# Patient Record
Sex: Female | Born: 1985 | Race: White | Hispanic: No | Smoking: Never smoker
Health system: Southern US, Community
[De-identification: ages and names within clinical notes are randomized; demographics above are authoritative.]

## PROBLEM LIST (undated history)

## (undated) DIAGNOSIS — E71311 Medium chain acyl CoA dehydrogenase deficiency: Secondary | ICD-10-CM

## (undated) DIAGNOSIS — D72829 Elevated white blood cell count, unspecified: Secondary | ICD-10-CM

## (undated) DIAGNOSIS — D849 Immunodeficiency, unspecified: Secondary | ICD-10-CM

## (undated) DIAGNOSIS — E079 Disorder of thyroid, unspecified: Secondary | ICD-10-CM

## (undated) DIAGNOSIS — A6920 Lyme disease, unspecified: Secondary | ICD-10-CM

## (undated) DIAGNOSIS — J45909 Unspecified asthma, uncomplicated: Secondary | ICD-10-CM

## (undated) DIAGNOSIS — E161 Other hypoglycemia: Secondary | ICD-10-CM

## (undated) DIAGNOSIS — F988 Other specified behavioral and emotional disorders with onset usually occurring in childhood and adolescence: Secondary | ICD-10-CM

## (undated) DIAGNOSIS — E2740 Unspecified adrenocortical insufficiency: Secondary | ICD-10-CM

## (undated) DIAGNOSIS — G90A Postural orthostatic tachycardia syndrome (POTS): Secondary | ICD-10-CM

## (undated) DIAGNOSIS — M1990 Unspecified osteoarthritis, unspecified site: Secondary | ICD-10-CM

## (undated) HISTORY — PX: ABDOMINAL SURGERY: SHX537

## (undated) HISTORY — PX: OTHER SURGICAL HISTORY: SHX169

---

## 2021-04-26 ENCOUNTER — Encounter (HOSPITAL_COMMUNITY): Payer: Self-pay

## 2021-04-26 ENCOUNTER — Emergency Department (HOSPITAL_COMMUNITY)
Admission: EM | Admit: 2021-04-26 | Discharge: 2021-04-26 | Disposition: A | Discharge status: Home / Self Care | Payer: Medicaid Other | Attending: Emergency Medicine | Admitting: Emergency Medicine

## 2021-04-26 ENCOUNTER — Other Ambulatory Visit: Payer: Self-pay

## 2021-04-26 ENCOUNTER — Emergency Department (HOSPITAL_COMMUNITY): Payer: Medicaid Other

## 2021-04-26 DIAGNOSIS — S92355A Nondisplaced fracture of fifth metatarsal bone, left foot, initial encounter for closed fracture: Secondary | ICD-10-CM | POA: Diagnosis not present

## 2021-04-26 DIAGNOSIS — J45909 Unspecified asthma, uncomplicated: Secondary | ICD-10-CM | POA: Diagnosis not present

## 2021-04-26 DIAGNOSIS — S99922A Unspecified injury of left foot, initial encounter: Secondary | ICD-10-CM | POA: Diagnosis present

## 2021-04-26 DIAGNOSIS — S92352A Displaced fracture of fifth metatarsal bone, left foot, initial encounter for closed fracture: Secondary | ICD-10-CM

## 2021-04-26 DIAGNOSIS — X509XXA Other and unspecified overexertion or strenuous movements or postures, initial encounter: Secondary | ICD-10-CM | POA: Insufficient documentation

## 2021-04-26 HISTORY — DX: Medium chain acyl CoA dehydrogenase deficiency: E71.311

## 2021-04-26 HISTORY — DX: Immunodeficiency, unspecified: D84.9

## 2021-04-26 HISTORY — DX: Unspecified asthma, uncomplicated: J45.909

## 2021-04-26 HISTORY — DX: Unspecified osteoarthritis, unspecified site: M19.90

## 2021-04-26 HISTORY — DX: Disorder of thyroid, unspecified: E07.9

## 2021-04-26 MED ORDER — OXYCODONE-ACETAMINOPHEN 5-325 MG PO TABS
1.0000 | ORAL_TABLET | Freq: Once | ORAL | Status: AC
  Administered 2021-04-26: 1 via ORAL
  Filled 2021-04-26: qty 1

## 2021-04-26 MED ORDER — HYDROCODONE-ACETAMINOPHEN 5-325 MG PO TABS: 1.0000 | ORAL_TABLET | ORAL | 0 refills | Status: DC | PRN

## 2021-04-26 NOTE — ED Provider Notes (Signed)
[facility] EMERGENCY DEPARTMENT Provider Note   CSN: [id number] Arrival date & time: 04/26/21  0123     History Chief Complaint  Patient presents with   Leg Injury    Melanie Graves is a 35 y.o. female.  Patient presents to the emergency department for evaluation of foot and ankle injury.  Patient reports that she stood up 3 days ago and felt and heard a loud pop, has been having pain and swelling, mostly on the lateral aspect of the foot but also the ankle ever since.      Past Medical History:  Diagnosis Date   Arthritis    Asthma    Immune deficiency disorder (HCC)    MCAD (medium-chain acyl-CoA dehydrogenase deficiency) (HCC)    Thyroid disease     There are no problems to display for this patient.   Past Surgical History:  Procedure Laterality Date   ABDOMINAL SURGERY     ballon sinus plasty       OB History     Gravida  3   Para  3   Term      Preterm      AB      Living         SAB      IAB      Ectopic      Multiple      Live Births              History reviewed. No pertinent family history.  Social History   Tobacco Use   Smoking status: Never   Smokeless tobacco: Never  Vaping Use   Vaping Use: Some days   Substances: Nicotine  Substance Use Topics   Alcohol use: Never   Drug use: Yes    Types: Marijuana    Home Medications Prior to Admission medications   Medication Sig Start Date End Date Taking? Authorizing Provider  HYDROcodone-acetaminophen (NORCO/VICODIN) 5-325 MG tablet Take 1 tablet by mouth every 4 (four) hours as needed. 04/26/21  Yes Jene Oravec, Gwenyth Allegra, MD    Allergies    Chlorhexidine, Fluconazole, and Risperdal [risperidone]  Review of Systems   Review of Systems  Musculoskeletal:  Positive for arthralgias.  Skin:  Positive for color change. Negative for wound.  Neurological: Negative.    Physical Exam Updated Vital Signs BP (!) 141/85 (BP Location: Right Arm)   Pulse (!) 117   Temp  98.3 F (36.8 C) (Oral)   Resp 16   Ht 5\' 7"  (1.702 m)   Wt 70.3 kg   LMP 04/26/2021 (Exact Date)   SpO2 99%   BMI 24.28 kg/m   Physical Exam Vitals and nursing note reviewed.  Constitutional:      Appearance: Normal appearance.  HENT:     Head: Atraumatic.  Musculoskeletal:     Right lower leg: Edema present.     Left lower leg: Edema present.     Left ankle: Tenderness present over the lateral malleolus.     Left Achilles Tendon: Normal.     Left foot: Swelling and tenderness present. No deformity.    ED Results / Procedures / Treatments   Labs (all labs ordered are listed, but only abnormal results are displayed) Labs Reviewed - No data to display  EKG None  Radiology DG Ankle Complete Left  Result Date: 04/26/2021 CLINICAL DATA:  Left ankle pain. EXAM: LEFT FOOT - COMPLETE 3+ VIEW; LEFT ANKLE COMPLETE - 3+ VIEW COMPARISON:  None. FINDINGS: Nondisplaced fracture of  the proximal fifth metatarsal. No acute fracture. There is no dislocation. The bones are well mineralized. No arthritic changes. There is soft tissue swelling of the lateral ankle and foot. No opaque foreign object or soft tissue gas. IMPRESSION: Nondisplaced fracture of the proximal fifth metatarsal. Electronically Signed   By: Anner Crete M.D.   On: 04/26/2021 02:32   DG Foot Complete Left  Result Date: 04/26/2021 CLINICAL DATA:  Left ankle pain. EXAM: LEFT FOOT - COMPLETE 3+ VIEW; LEFT ANKLE COMPLETE - 3+ VIEW COMPARISON:  None. FINDINGS: Nondisplaced fracture of the proximal fifth metatarsal. No acute fracture. There is no dislocation. The bones are well mineralized. No arthritic changes. There is soft tissue swelling of the lateral ankle and foot. No opaque foreign object or soft tissue gas. IMPRESSION: Nondisplaced fracture of the proximal fifth metatarsal. Electronically Signed   By: Anner Crete M.D.   On: 04/26/2021 02:32    Procedures Procedures   Medications Ordered in ED Medications   oxyCODONE-acetaminophen (PERCOCET/ROXICET) 5-325 MG per tablet 1 tablet (has no administration in time range)    ED Course  I have reviewed the triage vital signs and the nursing notes.  Pertinent labs & imaging results that were available during my care of the patient were reviewed by me and considered in my medical decision making (see chart for details).    MDM Rules/Calculators/A&P                           Patient presents with pain and swelling of the lateral aspect of her left foot.  Patient reports that she felt and heard a pop when she stood up.  X-ray shows nondisplaced fracture of the proximal fifth metatarsal.  No other injury noted.  Patient neurovascularly intact.,  Splint, nonweightbearing, follow-up with Ortho.  Final Clinical Impression(s) / ED Diagnoses Final diagnoses:  Closed displaced fracture of fifth metatarsal bone of left foot, initial encounter    Rx / DC Orders ED Discharge Orders          Ordered    HYDROcodone-acetaminophen (NORCO/VICODIN) 5-325 MG tablet  Every 4 hours PRN        04/26/21 0307             Orpah Greek, MD 04/26/21 [phone]

## 2021-04-26 NOTE — ED Triage Notes (Signed)
Pt arrived POV from home w c/o L ankle/foot pain that began 3 days after a loud pop while getting up. States pain is a 5/10

## 2021-04-27 ENCOUNTER — Telehealth: Payer: Self-pay

## 2021-04-27 MED FILL — Hydrocodone-Acetaminophen Tab 5-325 MG: ORAL | Qty: 6 | Status: AC

## 2021-04-27 NOTE — Telephone Encounter (Signed)
This is a error

## 2021-04-29 ENCOUNTER — Ambulatory Visit: Payer: Medicaid Other | Admitting: Orthopedic Surgery

## 2021-05-04 ENCOUNTER — Other Ambulatory Visit: Payer: Self-pay

## 2021-05-04 ENCOUNTER — Emergency Department (HOSPITAL_COMMUNITY)
Admission: EM | Admit: 2021-05-04 | Discharge: 2021-05-05 | Disposition: A | Discharge status: Home / Self Care | Payer: Medicaid Other | Attending: Emergency Medicine | Admitting: Emergency Medicine

## 2021-05-04 ENCOUNTER — Encounter (HOSPITAL_COMMUNITY): Payer: Self-pay | Admitting: Emergency Medicine

## 2021-05-04 DIAGNOSIS — X58XXXA Exposure to other specified factors, initial encounter: Secondary | ICD-10-CM | POA: Insufficient documentation

## 2021-05-04 DIAGNOSIS — M79672 Pain in left foot: Secondary | ICD-10-CM | POA: Insufficient documentation

## 2021-05-04 DIAGNOSIS — S99922A Unspecified injury of left foot, initial encounter: Secondary | ICD-10-CM | POA: Diagnosis present

## 2021-05-04 DIAGNOSIS — J45909 Unspecified asthma, uncomplicated: Secondary | ICD-10-CM | POA: Insufficient documentation

## 2021-05-04 DIAGNOSIS — R300 Dysuria: Secondary | ICD-10-CM | POA: Diagnosis not present

## 2021-05-04 DIAGNOSIS — S92355A Nondisplaced fracture of fifth metatarsal bone, left foot, initial encounter for closed fracture: Secondary | ICD-10-CM | POA: Insufficient documentation

## 2021-05-04 NOTE — ED Triage Notes (Addendum)
Pt states she need her splint to L foot/leg re-applied as well as more pain medicine. Pt also wants her urine checked. States she is having burning with urination and that last time she was checked, sample said she "was resistant to all antibiotics" and she wants that re-evaluated.

## 2021-05-05 LAB — URINALYSIS, ROUTINE W REFLEX MICROSCOPIC
Bilirubin Urine: NEGATIVE
Glucose, UA: NEGATIVE mg/dL
Hgb urine dipstick: NEGATIVE
Ketones, ur: NEGATIVE mg/dL
Leukocytes,Ua: NEGATIVE
Nitrite: NEGATIVE
Protein, ur: NEGATIVE mg/dL
Specific Gravity, Urine: 1.02 (ref 1.005–1.030)
pH: 6.5 (ref 5.0–8.0)

## 2021-05-05 NOTE — ED Provider Notes (Signed)
[facility] EMERGENCY DEPARTMENT Provider Note   CSN: [id number] Arrival date & time: 05/04/21  2155     History No chief complaint on file.   Melanie Graves is a 35 y.o. female.  Patient presents to the emergency department for evaluation of problem with a splint on her left foot.  Patient has 1/5 metatarsal fracture.  She does have follow-up scheduled with orthopedics.  Patient reports that the splint got wet and she has been unable to dry it.  She would like it replaced.  Patient also reports that she has a history of urinary tract infections.  She has had some burning with urination, would like her urine checked.  No fever, nausea, vomiting, flank or back pain.      Past Medical History:  Diagnosis Date   Arthritis    Asthma    Immune deficiency disorder (HCC)    MCAD (medium-chain acyl-CoA dehydrogenase deficiency) (HCC)    Thyroid disease     There are no problems to display for this patient.   Past Surgical History:  Procedure Laterality Date   ABDOMINAL SURGERY     ballon sinus plasty       OB History     Gravida  3   Para  3   Term      Preterm      AB      Living         SAB      IAB      Ectopic      Multiple      Live Births              History reviewed. No pertinent family history.  Social History   Tobacco Use   Smoking status: Never   Smokeless tobacco: Never  Vaping Use   Vaping Use: Some days   Substances: Nicotine  Substance Use Topics   Alcohol use: Never   Drug use: Yes    Types: Marijuana    Home Medications Prior to Admission medications   Medication Sig Start Date End Date Taking? Authorizing Provider  HYDROcodone-acetaminophen (NORCO/VICODIN) 5-325 MG tablet Take 1 tablet by mouth every 4 (four) hours as needed. 04/26/21   Orpah Greek, MD    Allergies    Chlorhexidine, Fluconazole, and Risperdal [risperidone]  Review of Systems   Review of Systems  Genitourinary:  Positive for  dysuria.  All other systems reviewed and are negative.  Physical Exam Updated Vital Signs BP (!) 133/101 (BP Location: Right Arm)    Pulse (!) 102    Temp 98.3 F (36.8 C) (Oral)    Resp 16    Ht 5\' 7"  (1.702 m)    Wt 70.3 kg    LMP 04/26/2021 (Exact Date)    SpO2 95%    BMI 24.27 kg/m   Physical Exam Vitals and nursing note reviewed.  Constitutional:      General: She is not in acute distress.    Appearance: Normal appearance. She is well-developed.  HENT:     Head: Normocephalic and atraumatic.     Right Ear: Hearing normal.     Left Ear: Hearing normal.     Nose: Nose normal.  Eyes:     Conjunctiva/sclera: Conjunctivae normal.     Pupils: Pupils are equal, round, and reactive to light.  Cardiovascular:     Rate and Rhythm: Regular rhythm.     Heart sounds: S1 normal and S2 normal. No murmur heard.   No  friction rub. No gallop.  Pulmonary:     Effort: Pulmonary effort is normal. No respiratory distress.     Breath sounds: Normal breath sounds.  Chest:     Chest wall: No tenderness.  Abdominal:     General: Bowel sounds are normal.     Palpations: Abdomen is soft.     Tenderness: There is no abdominal tenderness. There is no guarding or rebound. Negative signs include Murphy's sign and McBurney's sign.     Hernia: No hernia is present.  Musculoskeletal:        General: Normal range of motion.     Cervical back: Normal range of motion and neck supple.  Skin:    General: Skin is warm and dry.     Findings: No rash.  Neurological:     Mental Status: She is alert and oriented to person, place, and time.     GCS: GCS eye subscore is 4. GCS verbal subscore is 5. GCS motor subscore is 6.     Cranial Nerves: No cranial nerve deficit.     Sensory: No sensory deficit.     Coordination: Coordination normal.  Psychiatric:        Speech: Speech normal.        Behavior: Behavior normal.        Thought Content: Thought content normal.    ED Results / Procedures / Treatments    Labs (all labs ordered are listed, but only abnormal results are displayed) Labs Reviewed  URINALYSIS, ROUTINE W REFLEX MICROSCOPIC - Abnormal; Notable for the following components:      Result Value   APPearance HAZY (*)    All other components within normal limits  URINE CULTURE    EKG None  Radiology No results found.  Procedures Procedures   Medications Ordered in ED Medications - No data to display  ED Course  I have reviewed the triage vital signs and the nursing notes.  Pertinent labs & imaging results that were available during my care of the patient were reviewed by me and considered in my medical decision making (see chart for details).    MDM Rules/Calculators/A&P                         Presents because she got her temporary splint wet and it is falling apart.  This was replaced.  Patient also reports some dysuria.  She has a history of recurrent UTIs.  Her urinalysis, however, does not suggest infection.     Final Clinical Impression(s) / ED Diagnoses Final diagnoses:  Closed nondisplaced fracture of fifth metatarsal bone of left foot, initial encounter    Rx / DC Orders ED Discharge Orders     None        Orpah Greek, MD 05/05/21 [phone]

## 2021-05-06 ENCOUNTER — Ambulatory Visit: Payer: Medicaid Other | Admitting: Orthopedic Surgery

## 2021-05-07 ENCOUNTER — Encounter: Payer: Self-pay | Admitting: Orthopedic Surgery

## 2021-05-07 LAB — URINE CULTURE: Culture: >=100000 — AB

## 2021-05-08 ENCOUNTER — Telehealth: Payer: Self-pay | Admitting: *Deleted

## 2021-05-08 NOTE — Progress Notes (Signed)
ED Antimicrobial Stewardship Positive Culture Follow Up   Melanie Graves is an 35 y.o. female who presented to [facility] on 05/04/2021 with a chief complaint of No chief complaint on file.   Recent Results (from the past 720 hour(s))  Urine Culture     Status: Abnormal   Collection Time: 05/05/21 12:25 AM   Specimen: Urine, Clean Catch  Result Value Ref Range Status   Specimen Description   Final    URINE, CLEAN CATCH Performed at [facility], [address], [place], [place] [address]    Special Requests   Final    NONE Performed at [facility], [address], [place], [place] [address]    Culture >=100,000 COLONIES/mL ESCHERICHIA COLI (A)  Final   Report Status 05/07/2021 FINAL  Final   Organism ID, Bacteria ESCHERICHIA COLI (A)  Final      Susceptibility   Escherichia coli - MIC*    AMPICILLIN <=2 SENSITIVE Sensitive     CEFAZOLIN <=4 SENSITIVE Sensitive     CEFEPIME <=0.12 SENSITIVE Sensitive     CEFTRIAXONE <=0.25 SENSITIVE Sensitive     CIPROFLOXACIN <=0.25 SENSITIVE Sensitive     GENTAMICIN <=1 SENSITIVE Sensitive     IMIPENEM <=0.25 SENSITIVE Sensitive     NITROFURANTOIN <=16 SENSITIVE Sensitive     TRIMETH/SULFA <=20 SENSITIVE Sensitive     AMPICILLIN/SULBACTAM <=2 SENSITIVE Sensitive     PIP/TAZO <=4 SENSITIVE Sensitive     * >=100,000 COLONIES/mL ESCHERICHIA COLI     [x]  Patient discharged originally without antimicrobial agent and treatment is now indicated  New antibiotic prescription: Keflex  ED Provider: Margarita Mail, PA-C   Melanie Graves 05/08/2021, 7:23 AM Clinical Pharmacist Monday - Friday phone -  [phone] Saturday - Sunday phone - [phone]

## 2021-05-08 NOTE — Telephone Encounter (Signed)
Post ED Visit - Positive Culture Follow-up: Unsuccessful Patient Follow-up  Culture assessed and recommendations reviewed by:  []  Elenor Quinones, Pharm.D. []  Heide Guile, Pharm.D., BCPS AQ-ID []  Parks Neptune, Pharm.D., BCPS []  Alycia Rossetti, Pharm.D., BCPS []  [place], Pharm.D., BCPS, AAHIVP []  Legrand Como, Pharm.D., BCPS, AAHIVP []  Wynell Balloon, PharmD []  Vincenza Hews, PharmD, BCPS  Positive urine culture  [x]  Patient discharged without antimicrobial prescription and treatment is now indicated []  Organism is resistant to prescribed ED discharge antimicrobial []  Patient with positive blood cultures  Plan:  Keflex 500mg  TID x 5 days, Margarita Mail, PA  Unable to contact patient after 3 attempts, letter will be sent to address on file  Ardeen Fillers 05/08/2021, 9:11 AM

## 2021-05-13 ENCOUNTER — Ambulatory Visit: Payer: Medicaid Other

## 2021-05-13 ENCOUNTER — Telehealth: Payer: Self-pay | Admitting: Orthopedic Surgery

## 2021-05-13 ENCOUNTER — Encounter: Payer: Self-pay | Admitting: Orthopedic Surgery

## 2021-05-13 ENCOUNTER — Ambulatory Visit (INDEPENDENT_AMBULATORY_CARE_PROVIDER_SITE_OTHER): Payer: Medicaid Other | Admitting: Orthopedic Surgery

## 2021-05-13 ENCOUNTER — Other Ambulatory Visit: Payer: Self-pay

## 2021-05-13 DIAGNOSIS — S92352A Displaced fracture of fifth metatarsal bone, left foot, initial encounter for closed fracture: Secondary | ICD-10-CM

## 2021-05-13 NOTE — Progress Notes (Signed)
New Patient Visit  Assessment: Melanie Graves is a 35 y.o. female with the following: 1. Closed displaced fracture of fifth metatarsal bone of left foot, initial encounter  Plan: Reviewed radiographs with the patient in clinic today.  Nondisplaced fracture at the base of the left fifth metatarsal.  No interval displacement compared to the injury x-rays.  We will transition her to a walking boot in clinic today.  She can start to advance her weightbearing in the boot.  Advised her to use the crutches to assist with ambulation.  In 2-3 weeks, she is comfortable, she can remove the boot, and start ambulating with a regular shoe.  In regards to her very complicated medical history, we provided her with information to establish care at [facility], as this is an academic center, and could potentially provide her with definitive care for her many medical problems.   Follow-up: Return in about 4 weeks (around 06/10/2021).  Subjective:  Chief Complaint  Patient presents with   Foot Injury    DOI 04/26/21 Closed displaced fracture fifth metatarsal left foot    History of Present Illness: Melanie Graves is a 35 y.o. female who presents for evaluation of left foot injury.  Approximately 2-3 weeks ago, she states that she stood up from a seated position, and felt a pop.  She presented to the emergency department, and was noted to have a fracture at the base of the fifth metatarsal.  She was placed in a splint, and has remained nonweightbearing.  She has had some issues with the splinting and has had to have them adjusted multiple times.  She is also had 2 prior appointments scheduled for clinic, and had to reschedule these appointments due to some outside emergencies.  She is using crutches to assist with ambulation.  She is also current medications due to autoimmune conditions.  Included in the medications as chronic steroid use.  She also states that she has a history of Ehlers-Danlos, and has some  chronic joint issues as a result.   Review of Systems: No fevers or chills No numbness or tingling No chest pain No shortness of breath No bowel or bladder dysfunction No GI distress No headaches   Medical History:  Past Medical History:  Diagnosis Date   Arthritis    Asthma    Immune deficiency disorder (HCC)    MCAD (medium-chain acyl-CoA dehydrogenase deficiency) ([place])    Thyroid disease     Past Surgical History:  Procedure Laterality Date   ABDOMINAL SURGERY     ballon sinus plasty      History reviewed. No pertinent family history. Social History   Tobacco Use   Smoking status: Never   Smokeless tobacco: Never  Vaping Use   Vaping Use: Some days   Substances: Nicotine  Substance Use Topics   Alcohol use: Never   Drug use: Yes    Types: Marijuana    Allergies  Allergen Reactions   Beta Adrenergic Blockers Other (See Comments)    Patient states it is contraindicated for her.    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    Objective: LMP 04/26/2021 (Exact  Date)   Physical Exam:  General: Alert and oriented. and No acute distress. Gait: Ambulates with the assistance of crutches.  Evaluation left foot demonstrates no deformity.  Mild swelling on the lateral aspect of the foot.  Tenderness palpation of the base of the fifth metatarsal.  Sensation is intact to the dorsum of the foot.  She has discoloration of bilateral lower extremities, related to her chronic conditions.  Toes are warm and well-perfused.   IMAGING: I personally ordered and reviewed the following images  X-ray of the left foot was obtained in clinic  today and demonstrates a nondisplaced fracture at the base of the fifth metatarsal.  Compared to prior x-rays, there is been no interval displacement.  No callus formation is visualized.  No other injuries are noted.  Impression: Nondisplaced fracture of the base of the left fifth metatarsal.   New Medications:  No orders of the defined types were placed in this encounter.     Mordecai Rasmussen, MD  05/13/2021 11:18 AM

## 2021-05-13 NOTE — Telephone Encounter (Signed)
Patient left message relaying that the boot is painful, moreso than expected; asking if Dr Amedeo Kinsman can order something for pain? If so, states would like it to be at [facility] in [place]; otherwise, previously has previously used [facility] in [place].

## 2021-05-13 NOTE — Patient Instructions (Addendum)
PLEASE CALL [phone] TO GET SCHEDULED WITH A PRIMARY CARE PROVIDER  [address] [place] in the downtown area but will provide a secure location for people living in their cars to park overnight.  Transition to a walking boot.  OK to bear weight in the boot.   Can use crutches to assist with ambulation  In 2-3 weeks, can start to come out of the boot if pain allows  Follow up in 4 weeks for repeat evaluation

## 2021-05-14 NOTE — Telephone Encounter (Signed)
Attempted to call pt, unable to leave VM mailbox is full.

## 2021-05-26 ENCOUNTER — Telehealth (HOSPITAL_COMMUNITY): Payer: Self-pay

## 2021-05-26 NOTE — Telephone Encounter (Signed)
Called to schedule catheter replacement, no answer, left vm. AW

## 2021-05-28 ENCOUNTER — Telehealth (HOSPITAL_COMMUNITY): Payer: Self-pay

## 2021-05-28 NOTE — Telephone Encounter (Signed)
Called to schedule catheter replacement, no answer, left vm. AW

## 2021-06-01 ENCOUNTER — Other Ambulatory Visit (HOSPITAL_COMMUNITY): Payer: Self-pay | Admitting: Pediatrics

## 2021-06-01 ENCOUNTER — Telehealth (HOSPITAL_COMMUNITY): Payer: Self-pay

## 2021-06-01 DIAGNOSIS — I951 Orthostatic hypotension: Secondary | ICD-10-CM

## 2021-06-01 NOTE — Telephone Encounter (Signed)
Returned call, no answer, left vm. AW

## 2021-06-02 ENCOUNTER — Ambulatory Visit (HOSPITAL_COMMUNITY): Payer: Medicaid Other

## 2021-06-03 ENCOUNTER — Other Ambulatory Visit: Payer: Self-pay | Admitting: Internal Medicine

## 2021-06-04 ENCOUNTER — Ambulatory Visit (HOSPITAL_COMMUNITY): Admission: RE | Admit: 2021-06-04 | Payer: Medicaid Other | Source: Ambulatory Visit

## 2021-06-04 ENCOUNTER — Other Ambulatory Visit: Payer: Self-pay | Admitting: Student

## 2021-06-04 ENCOUNTER — Encounter (HOSPITAL_COMMUNITY): Payer: Self-pay

## 2021-06-04 DIAGNOSIS — D8940 Mast cell activation, unspecified: Secondary | ICD-10-CM

## 2021-06-05 ENCOUNTER — Encounter (HOSPITAL_COMMUNITY): Payer: Self-pay | Admitting: Emergency Medicine

## 2021-06-05 ENCOUNTER — Telehealth: Payer: Self-pay | Admitting: Student

## 2021-06-05 ENCOUNTER — Emergency Department (HOSPITAL_COMMUNITY): Payer: Medicaid Other

## 2021-06-05 ENCOUNTER — Observation Stay (HOSPITAL_COMMUNITY)
Admission: EM | Admit: 2021-06-05 | Discharge: 2021-06-08 | Disposition: A | Discharge status: Home / Self Care | Payer: Medicaid Other | Attending: Family Medicine | Admitting: Family Medicine

## 2021-06-05 ENCOUNTER — Telehealth (HOSPITAL_COMMUNITY): Payer: Self-pay | Admitting: Radiology

## 2021-06-05 ENCOUNTER — Other Ambulatory Visit (HOSPITAL_COMMUNITY): Payer: Self-pay | Admitting: Pediatrics

## 2021-06-05 DIAGNOSIS — F1290 Cannabis use, unspecified, uncomplicated: Secondary | ICD-10-CM | POA: Insufficient documentation

## 2021-06-05 DIAGNOSIS — S92353A Displaced fracture of fifth metatarsal bone, unspecified foot, initial encounter for closed fracture: Secondary | ICD-10-CM

## 2021-06-05 DIAGNOSIS — Z888 Allergy status to other drugs, medicaments and biological substances status: Secondary | ICD-10-CM

## 2021-06-05 DIAGNOSIS — Y849 Medical procedure, unspecified as the cause of abnormal reaction of the patient, or of later complication, without mention of misadventure at the time of the procedure: Secondary | ICD-10-CM | POA: Insufficient documentation

## 2021-06-05 DIAGNOSIS — D8940 Mast cell activation, unspecified: Secondary | ICD-10-CM | POA: Diagnosis present

## 2021-06-05 DIAGNOSIS — E2740 Unspecified adrenocortical insufficiency: Secondary | ICD-10-CM | POA: Diagnosis present

## 2021-06-05 DIAGNOSIS — E258 Other adrenogenital disorders: Secondary | ICD-10-CM | POA: Insufficient documentation

## 2021-06-05 DIAGNOSIS — D849 Immunodeficiency, unspecified: Secondary | ICD-10-CM | POA: Diagnosis present

## 2021-06-05 DIAGNOSIS — J45909 Unspecified asthma, uncomplicated: Secondary | ICD-10-CM | POA: Insufficient documentation

## 2021-06-05 DIAGNOSIS — Z7951 Long term (current) use of inhaled steroids: Secondary | ICD-10-CM

## 2021-06-05 DIAGNOSIS — F988 Other specified behavioral and emotional disorders with onset usually occurring in childhood and adolescence: Secondary | ICD-10-CM | POA: Diagnosis not present

## 2021-06-05 DIAGNOSIS — T82898A Other specified complication of vascular prosthetic devices, implants and grafts, initial encounter: Secondary | ICD-10-CM | POA: Diagnosis present

## 2021-06-05 DIAGNOSIS — D8949 Other mast cell activation disorder: Secondary | ICD-10-CM | POA: Diagnosis not present

## 2021-06-05 DIAGNOSIS — M79671 Pain in right foot: Secondary | ICD-10-CM | POA: Diagnosis not present

## 2021-06-05 DIAGNOSIS — G90A Postural orthostatic tachycardia syndrome (POTS): Secondary | ICD-10-CM | POA: Diagnosis not present

## 2021-06-05 DIAGNOSIS — A6920 Lyme disease, unspecified: Secondary | ICD-10-CM | POA: Diagnosis present

## 2021-06-05 DIAGNOSIS — D4709 Other mast cell neoplasms of uncertain behavior: Secondary | ICD-10-CM

## 2021-06-05 DIAGNOSIS — E876 Hypokalemia: Secondary | ICD-10-CM | POA: Diagnosis not present

## 2021-06-05 DIAGNOSIS — W1830XA Fall on same level, unspecified, initial encounter: Secondary | ICD-10-CM | POA: Diagnosis present

## 2021-06-05 DIAGNOSIS — Y712 Prosthetic and other implants, materials and accessory cardiovascular devices associated with adverse incidents: Secondary | ICD-10-CM | POA: Diagnosis present

## 2021-06-05 DIAGNOSIS — Z91018 Allergy to other foods: Secondary | ICD-10-CM

## 2021-06-05 DIAGNOSIS — Z79899 Other long term (current) drug therapy: Secondary | ICD-10-CM

## 2021-06-05 DIAGNOSIS — E71311 Medium chain acyl CoA dehydrogenase deficiency: Secondary | ICD-10-CM | POA: Diagnosis present

## 2021-06-05 DIAGNOSIS — T82594A Other mechanical complication of infusion catheter, initial encounter: Principal | ICD-10-CM | POA: Diagnosis present

## 2021-06-05 DIAGNOSIS — Z789 Other specified health status: Secondary | ICD-10-CM

## 2021-06-05 DIAGNOSIS — F419 Anxiety disorder, unspecified: Secondary | ICD-10-CM | POA: Diagnosis present

## 2021-06-05 DIAGNOSIS — Z825 Family history of asthma and other chronic lower respiratory diseases: Secondary | ICD-10-CM

## 2021-06-05 HISTORY — DX: Postural orthostatic tachycardia syndrome (POTS): G90.A

## 2021-06-05 HISTORY — DX: Other specified behavioral and emotional disorders with onset usually occurring in childhood and adolescence: F98.8

## 2021-06-05 HISTORY — DX: Unspecified adrenocortical insufficiency: E27.40

## 2021-06-05 HISTORY — DX: Other hypoglycemia: E16.1

## 2021-06-05 HISTORY — DX: Lyme disease, unspecified: A69.20

## 2021-06-05 LAB — CBC WITH DIFFERENTIAL/PLATELET
Abs Immature Granulocytes: 0.04 10*3/uL (ref 0.00–0.07)
Basophils Absolute: 0 10*3/uL (ref 0.0–0.1)
Basophils Relative: 0 %
Eosinophils Absolute: 0 10*3/uL (ref 0.0–0.5)
Eosinophils Relative: 0 %
HCT: 34.9 % — ABNORMAL LOW (ref 36.0–46.0)
Hemoglobin: 11.1 g/dL — ABNORMAL LOW (ref 12.0–15.0)
Immature Granulocytes: 0 %
Lymphocytes Relative: 14 %
Lymphs Abs: 1.8 10*3/uL (ref 0.7–4.0)
MCH: 27.1 pg (ref 26.0–34.0)
MCHC: 31.8 g/dL (ref 30.0–36.0)
MCV: 85.1 fL (ref 80.0–100.0)
Monocytes Absolute: 0.7 10*3/uL (ref 0.1–1.0)
Monocytes Relative: 6 %
Neutro Abs: 10.2 10*3/uL — ABNORMAL HIGH (ref 1.7–7.7)
Neutrophils Relative %: 80 %
Platelets: 294 10*3/uL (ref 150–400)
RBC: 4.1 MIL/uL (ref 3.87–5.11)
RDW: 15.9 % — ABNORMAL HIGH (ref 11.5–15.5)
WBC: 12.8 10*3/uL — ABNORMAL HIGH (ref 4.0–10.5)
nRBC: 0 % (ref 0.0–0.2)

## 2021-06-05 LAB — COMPREHENSIVE METABOLIC PANEL
ALT: 23 U/L (ref 0–44)
AST: 25 U/L (ref 15–41)
Albumin: 3.3 g/dL — ABNORMAL LOW (ref 3.5–5.0)
Alkaline Phosphatase: 45 U/L (ref 38–126)
Anion gap: 9 (ref 5–15)
BUN: <5 mg/dL — ABNORMAL LOW (ref 6–20)
CO2: 24 mmol/L (ref 22–32)
Calcium: 8.4 mg/dL — ABNORMAL LOW (ref 8.9–10.3)
Chloride: 103 mmol/L (ref 98–111)
Creatinine, Ser: 0.59 mg/dL (ref 0.44–1.00)
GFR, Estimated: >60 mL/min (ref >60)
Glucose, Bld: 100 mg/dL — ABNORMAL HIGH (ref 70–99)
Potassium: 3.7 mmol/L (ref 3.5–5.1)
Sodium: 136 mmol/L (ref 135–145)
Total Bilirubin: 0.7 mg/dL (ref 0.3–1.2)
Total Protein: 6 g/dL — ABNORMAL LOW (ref 6.5–8.1)

## 2021-06-05 LAB — TROPONIN I (HIGH SENSITIVITY): Troponin I (High Sensitivity): 4 ng/L (ref <18)

## 2021-06-05 MED ORDER — HYDROCORTISONE SOD SUC (PF) 100 MG IJ SOLR
100.0000 mg | Freq: Once | INTRAMUSCULAR | Status: AC
  Administered 2021-06-05: 100 mg via INTRAVENOUS
  Filled 2021-06-05: qty 2

## 2021-06-05 MED ORDER — SODIUM CHLORIDE 0.9 % IV SOLN
25.0000 mg | Freq: Once | INTRAVENOUS | Status: AC
  Administered 2021-06-05: 25 mg via INTRAVENOUS
  Filled 2021-06-05: qty 1

## 2021-06-05 MED ORDER — HYDROXYZINE HCL 25 MG PO TABS
25.0000 mg | ORAL_TABLET | Freq: Once | ORAL | Status: AC
  Administered 2021-06-05: 25 mg via ORAL
  Filled 2021-06-05: qty 1

## 2021-06-05 MED ORDER — FAMOTIDINE IN NACL 20-0.9 MG/50ML-% IV SOLN
20.0000 mg | Freq: Once | INTRAVENOUS | Status: AC
Start: 2021-06-05 — End: 2021-06-05
  Administered 2021-06-05: 20 mg via INTRAVENOUS
  Filled 2021-06-05: qty 50

## 2021-06-05 MED ORDER — DIPHENHYDRAMINE HCL 50 MG/ML IJ SOLN
25.0000 mg | Freq: Once | INTRAMUSCULAR | Status: AC
Start: 2021-06-05 — End: 2021-06-05
  Administered 2021-06-05: 25 mg via INTRAVENOUS
  Filled 2021-06-05: qty 1

## 2021-06-05 NOTE — ED Triage Notes (Signed)
Pt states her central line is not working since today. States she has it for TPN, fluids, vitamins due to her MCAD. States she also fell yesterday. Endorses pain to bilateral feet and ankles, left knee, right hand and right shoulder.

## 2021-06-05 NOTE — ED Provider Triage Note (Signed)
Emergency Medicine Provider Triage Evaluation Note  Melanie Graves , a 36 y.o. female  was evaluated in triage.  Pt complains of needing a new line repaced  Review of Systems  Positive: weakness Negative: fever  Physical Exam  BP (!) 142/102 (BP Location: Right Arm)    Pulse (!) 112    Temp 98.4 F (36.9 C) (Oral)    Resp (!) 24    SpO2 97%  Gen:   Awake, no distress   Resp:  Normal effort  MSK:   Moves extremities without difficulty  Other:    Medical Decision Making  Medically screening exam initiated at 5:02 PM.  Appropriate orders placed.  Melanie Graves was informed that the remainder of the evaluation will be completed by another provider, this initial triage assessment does not replace that evaluation, and the importance of remaining in the ED until their evaluation is complete.     Melanie Graves, [place] 06/05/21 1704

## 2021-06-05 NOTE — Telephone Encounter (Signed)
Dr. Marin Roberts called to discuss tunneled central line replacement for Melanie Graves.  Unfortunately, she was unable to make her scheduled appointment yesterday and her line is currently not functioning.  We cannot schedule her for procedure with sedation today.  Dr. Marin Roberts feels line exchange with sedation would be best. He is agreeable to scheduling next available appointment for tunneled line exchange with sedation. He is aware that IR does not routinely place central lines on the weekend and the current plan is to keep her scheduled appointment for placement with IR.  Brynda Greathouse, MS RD PA-C

## 2021-06-05 NOTE — ED Notes (Signed)
Patient transported to X-ray 

## 2021-06-05 NOTE — ED Provider Notes (Signed)
[facility] EMERGENCY DEPARTMENT Provider Note   CSN: [id number] Arrival date & time: 06/05/21  1634     History  Chief Complaint  Patient presents with   Fall   Vascular Access Problem    Melanie Graves is a 36 y.o. female with a past medical history mast cell anaphylaxis disorder who presents to the ED complaining of vascular access problem onset today. Patient was to have her central line replaced 2 days ago however missed her appointment.  She notes that one of the catheters is leaking and the other catheter is blocked.  She has not been able to obtain her medications.  She has not had her TPN because she has not been evaluated by home health.  She is new to the area from [place] and has been previously evaluated in [place] for her comorbidities.  Patient also has associated left-sided, nonradiating intermittent chest pain, shortness of breath, back pain (patient was previously evaluated for this), right foot and ankle pain. Patient reports that she had a mechanical fall yesterday.  Denies LOC.     The history is provided by the patient and the spouse. No language interpreter was used.   Past Medical History:  Diagnosis Date   Arthritis    Asthma    Immune deficiency disorder (HCC)    MCAD (medium-chain acyl-CoA dehydrogenase deficiency) (HCC)    Thyroid disease    Past Surgical History:  Procedure Laterality Date   ABDOMINAL SURGERY     ballon sinus plasty          Home Medications Prior to Admission medications   Medication Sig Start Date End Date Taking? Authorizing Provider  HYDROcodone-acetaminophen (NORCO/VICODIN) 5-325 MG tablet Take 1 tablet by mouth every 4 (four) hours as needed. 04/26/21   Orpah Greek, MD      Allergies    Beta adrenergic blockers, Chlorhexidine, Fluconazole, and Risperdal [risperidone]    Review of Systems   Review of Systems  Constitutional:  Negative for chills and fever.  Respiratory:  Negative for  shortness of breath.   Cardiovascular:  Positive for chest pain.  Gastrointestinal:  Negative for abdominal pain, nausea and vomiting.  Musculoskeletal:  Positive for arthralgias. Negative for joint swelling.  Skin:  Positive for color change. Negative for rash and wound.  All other systems reviewed and are negative.  Physical Exam Updated Vital Signs BP 127/84    Pulse 84    Temp 98 F (36.7 C) (Oral)    Resp 16    SpO2 100%  Physical Exam Vitals and nursing note reviewed.  Constitutional:      General: She is not in acute distress.    Appearance: She is not diaphoretic.  HENT:     Head: Normocephalic and atraumatic.     Mouth/Throat:     Pharynx: No oropharyngeal exudate.  Eyes:     General: No scleral icterus.    Conjunctiva/sclera: Conjunctivae normal.  Cardiovascular:     Rate and Rhythm: Normal rate and regular rhythm.     Pulses: Normal pulses.     Heart sounds: Normal heart sounds.  Pulmonary:     Effort: Pulmonary effort is normal. No respiratory distress.     Breath sounds: Normal breath sounds. No wheezing.  Chest:     Comments: Central line in place to right upper chest wall.  No surrounding erythema or drainage noted from the area.  No chest wall tenderness to palpation. Abdominal:     General: Bowel sounds  are normal.     Palpations: Abdomen is soft. There is no mass.     Tenderness: There is no abdominal tenderness. There is no guarding or rebound.  Musculoskeletal:        General: Normal range of motion.     Cervical back: Normal range of motion and neck supple.     Comments: Mild tenderness to palpation noted to right foot and ankle.  Sensation intact to bilateral lower extremities.  Mild tenderness to palpation to right shoulder.  Decreased range of motion of right shoulder secondary to pain.  Skin:    General: Skin is warm and dry.  Neurological:     Mental Status: She is alert.  Psychiatric:        Behavior: Behavior normal.    ED Results /  Procedures / Treatments   Labs (all labs ordered are listed, but only abnormal results are displayed) Labs Reviewed  COMPREHENSIVE METABOLIC PANEL - Abnormal; Notable for the following components:      Result Value   Glucose, Bld 100 (*)    BUN <5 (*)    Calcium 8.4 (*)    Total Protein 6.0 (*)    Albumin 3.3 (*)    All other components within normal limits  CBC WITH DIFFERENTIAL/PLATELET - Abnormal; Notable for the following components:   WBC 12.8 (*)    Hemoglobin 11.1 (*)    HCT 34.9 (*)    RDW 15.9 (*)    Neutro Abs 10.2 (*)    All other components within normal limits  TROPONIN I (HIGH SENSITIVITY)  TROPONIN I (HIGH SENSITIVITY)    EKG EKG Interpretation  Date/Time:  Friday June 05 2021 23:43:38 EST Ventricular Rate:  91 PR Interval:  132 QRS Duration: 80 QT Interval:  386 QTC Calculation: 474 R Axis:   76 Text Interpretation: Normal sinus rhythm Normal ECG No previous ECGs available Confirmed by Ripley Fraise [phone]) on 06/05/2021 11:59:26 PM  Radiology DG Chest 2 View  Result Date: 06/05/2021 CLINICAL DATA:  Fall, right shoulder pain EXAM: CHEST - 2 VIEW COMPARISON:  None. FINDINGS: Right internal jugular PICC line in place with the tip at the cavoatrial junction. Heart and mediastinal contours are within normal limits. No focal opacities or effusions. No acute bony abnormality. No visible rib fracture or pneumothorax. IMPRESSION: No active cardiopulmonary disease. Electronically Signed   By: Rolm Baptise M.D.   On: 06/05/2021 22:58   DG Shoulder Right  Result Date: 06/05/2021 CLINICAL DATA:  Fall EXAM: RIGHT SHOULDER - 2+ VIEW COMPARISON:  None. FINDINGS: There is no acute fracture or dislocation. Joint spaces are maintained. Soft tissue calcifications are seen inferior to the glenoid which may be related to old injury. Right-sided central venous catheter is partially visualized. IMPRESSION: 1. No acute fracture or dislocation. Electronically Signed   By: Ronney Asters M.D.   On: 06/05/2021 22:57   DG Ankle 2 Views Right  Result Date: 06/05/2021 CLINICAL DATA:  Fall, ankle pain EXAM: RIGHT ANKLE - 2 VIEW COMPARISON:  None. FINDINGS: Corticated bone density adjacent to the lateral malleolus felt to reflect old injury or secondary ossification center. Recommend correlation for pain in this area. No other evidence of acute fracture, subluxation or dislocation. IMPRESSION: Well corticated bone density adjacent to the lateral malleolus felt to reflect old injury or secondary ossification center. No visible acute fracture. Electronically Signed   By: Rolm Baptise M.D.   On: 06/05/2021 22:57   DG Foot 2 Views Right  Result Date: 06/05/2021 CLINICAL DATA:  Fall, right foot pain EXAM: RIGHT FOOT - 2 VIEW COMPARISON:  None. FINDINGS: There is no evidence of fracture or dislocation. There is no evidence of arthropathy or other focal bone abnormality. Soft tissues are unremarkable. IMPRESSION: Negative. Electronically Signed   By: Rolm Baptise M.D.   On: 06/05/2021 22:56    Procedures Procedures    Medications Ordered in ED Medications  hydrocortisone sodium succinate (SOLU-CORTEF) 100 MG injection 100 mg (100 mg Intravenous Given 06/05/21 2223)  diphenhydrAMINE (BENADRYL) injection 25 mg (25 mg Intravenous Given 06/05/21 2223)  promethazine (PHENERGAN) 25 mg in sodium chloride 0.9 % 50 mL IVPB (0 mg Intravenous Stopped 06/05/21 2329)  hydrOXYzine (ATARAX) tablet 25 mg (25 mg Oral Given 06/05/21 2225)  famotidine (PEPCID) IVPB 20 mg premix (0 mg Intravenous Stopped 06/05/21 2256)    ED Course/ Medical Decision Making/ A&P Clinical Course as of 06/06/21 0028  Fri Jun 05, 2021  2319 Discussed with patient and spouse at bedside regarding hospital admission for catheter management and access.  Patient agreeable at this time. [SB]  Sat Jun 06, 2021  0000 Consult with Dr. Posey Pronto who recommends IR consult without medical admission. [SB]  0020 Discussion with Dr. Posey Pronto  to recommend admission due to TPN needs and replacement of central line. Dr. Posey Pronto again noted that patient can be managed with IR consult in the ED and social work management follow up. [SB]  0025 Case signed out to Attending, Dr. Christy Gentles. [SB]    Clinical Course User Index [SB] Michaline Kindig A, PA-C                           Medical Decision Making Amount and/or Complexity of Data Reviewed Labs: ordered. Radiology: ordered.  Risk Prescription drug management.   Patient presents to the ED with concerns for vascular access issues onset today.  She uses it for her TPN, fluids, vitamins due to her MCAD.  She also had a mechanical fall yesterday and endorses pain to her right foot, right ankle, right shoulder.  Vital signs stable, patient afebrile.  On exam patient without acute cardiovascular, respiratory, abdominal exam findings.  Tenderness to palpation noted to right foot and ankle and shoulder.  DP and PT pulses intact bilaterally.  No obvious deformity noted.    EKG: No acute ST/T changes.  Labs:  I ordered, and personally interpreted labs.  The pertinent results include: CMP unremarkable. CBC with mildly elevated WBC at 12.8. Initial troponin of 4, repeat troponin pending at signout.  Imaging: I ordered imaging studies including CXR, right ankle xray, right foot xray, right shoulder xray I independently visualized and interpreted imaging which showed:  CXR:  IMPRESSION:  No active cardiopulmonary disease.   Right shoulder xray:  IMPRESSION:  1. No acute fracture or dislocation.   Right foot xray:  IMPRESSION:  Negative.   Right ankle xray:  IMPRESSION:  Well corticated bone density adjacent to the lateral malleolus felt  to reflect old injury or secondary ossification center. No visible  acute fracture.   I agree with the radiologist interpretation  Medications:  I ordered medication including Phenergan, Pepcid, Atarax, Benadryl, Solu-Cortef for IV maintenance  for patient's MCAD. Reevaluation of the patient after these medicines showed that the patient improved I have reviewed the patients home medicines and have made adjustments as needed  Reevaluation: After the interventions noted above, I reevaluated the patient and found that they have :improved  Consultations: I requested consultation with the hospitalist, Dr. Posey Pronto,  and discussed lab and imaging findings as well as pertinent plan - they recommend: No admission at this time.  Recommends IR consult.  Disposition: Patient case discussed with Attending, Dr. Christy Gentles at sign-out. Plan at sign-out is IR consult, TPN consult and pending discharge following. Patient care transferred at sign out.   This chart was dictated using voice recognition software, Dragon. Despite the best efforts of this provider to proofread and correct errors, errors may still occur which can change documentation meaning.   Final Clinical Impression(s) / ED Diagnoses Final diagnoses:  Problem with vascular access    Rx / DC Orders ED Discharge Orders     None         Jaquel Coomer A, PA-C 06/06/21 0028    Tegeler, Gwenyth Allegra, MD 06/07/21 1356

## 2021-06-05 NOTE — Telephone Encounter (Signed)
PA received a message requesting a call back from patient's [facility] RN, Allyn Kenner regarding line placement/replacement.  Patient was scheduled for an appointment in [facility] IR yesterday, however did not show for the appointment.  No answer, left message to return call.   Brynda Greathouse, MS RD PA-C

## 2021-06-06 ENCOUNTER — Encounter (HOSPITAL_COMMUNITY): Payer: Self-pay | Admitting: Internal Medicine

## 2021-06-06 DIAGNOSIS — J45909 Unspecified asthma, uncomplicated: Secondary | ICD-10-CM | POA: Diagnosis present

## 2021-06-06 DIAGNOSIS — Y712 Prosthetic and other implants, materials and accessory cardiovascular devices associated with adverse incidents: Secondary | ICD-10-CM | POA: Diagnosis present

## 2021-06-06 DIAGNOSIS — D849 Immunodeficiency, unspecified: Secondary | ICD-10-CM | POA: Diagnosis present

## 2021-06-06 DIAGNOSIS — E2740 Unspecified adrenocortical insufficiency: Secondary | ICD-10-CM

## 2021-06-06 DIAGNOSIS — A6920 Lyme disease, unspecified: Secondary | ICD-10-CM | POA: Diagnosis present

## 2021-06-06 DIAGNOSIS — Z888 Allergy status to other drugs, medicaments and biological substances status: Secondary | ICD-10-CM | POA: Diagnosis not present

## 2021-06-06 DIAGNOSIS — E71311 Medium chain acyl CoA dehydrogenase deficiency: Secondary | ICD-10-CM | POA: Diagnosis present

## 2021-06-06 DIAGNOSIS — F988 Other specified behavioral and emotional disorders with onset usually occurring in childhood and adolescence: Secondary | ICD-10-CM | POA: Diagnosis present

## 2021-06-06 DIAGNOSIS — Z91018 Allergy to other foods: Secondary | ICD-10-CM | POA: Diagnosis not present

## 2021-06-06 DIAGNOSIS — W1830XA Fall on same level, unspecified, initial encounter: Secondary | ICD-10-CM | POA: Diagnosis present

## 2021-06-06 DIAGNOSIS — F419 Anxiety disorder, unspecified: Secondary | ICD-10-CM | POA: Diagnosis present

## 2021-06-06 DIAGNOSIS — D8940 Mast cell activation, unspecified: Secondary | ICD-10-CM

## 2021-06-06 DIAGNOSIS — E876 Hypokalemia: Secondary | ICD-10-CM | POA: Diagnosis present

## 2021-06-06 DIAGNOSIS — Z7951 Long term (current) use of inhaled steroids: Secondary | ICD-10-CM | POA: Diagnosis not present

## 2021-06-06 DIAGNOSIS — F909 Attention-deficit hyperactivity disorder, unspecified type: Secondary | ICD-10-CM | POA: Diagnosis not present

## 2021-06-06 DIAGNOSIS — S92353A Displaced fracture of fifth metatarsal bone, unspecified foot, initial encounter for closed fracture: Secondary | ICD-10-CM

## 2021-06-06 DIAGNOSIS — T82594A Other mechanical complication of infusion catheter, initial encounter: Secondary | ICD-10-CM | POA: Diagnosis present

## 2021-06-06 DIAGNOSIS — G90A Postural orthostatic tachycardia syndrome (POTS): Secondary | ICD-10-CM

## 2021-06-06 DIAGNOSIS — Z79899 Other long term (current) drug therapy: Secondary | ICD-10-CM | POA: Diagnosis not present

## 2021-06-06 DIAGNOSIS — Z825 Family history of asthma and other chronic lower respiratory diseases: Secondary | ICD-10-CM | POA: Diagnosis not present

## 2021-06-06 LAB — URINALYSIS, ROUTINE W REFLEX MICROSCOPIC
Bilirubin Urine: NEGATIVE
Glucose, UA: NEGATIVE mg/dL
Hgb urine dipstick: NEGATIVE
Ketones, ur: NEGATIVE mg/dL
Nitrite: NEGATIVE
Protein, ur: NEGATIVE mg/dL
Specific Gravity, Urine: 1.008 (ref 1.005–1.030)
pH: 8 (ref 5.0–8.0)

## 2021-06-06 LAB — CBG MONITORING, ED: Glucose-Capillary: 123 mg/dL — ABNORMAL HIGH (ref 70–99)

## 2021-06-06 LAB — MAGNESIUM: Magnesium: 2.4 mg/dL (ref 1.7–2.4)

## 2021-06-06 LAB — GLUCOSE, CAPILLARY: Glucose-Capillary: 95 mg/dL (ref 70–99)

## 2021-06-06 LAB — TROPONIN I (HIGH SENSITIVITY): Troponin I (High Sensitivity): 5 ng/L (ref <18)

## 2021-06-06 LAB — PHOSPHORUS: Phosphorus: 3 mg/dL (ref 2.5–4.6)

## 2021-06-06 LAB — TRIGLYCERIDES: Triglycerides: 36 mg/dL (ref <150)

## 2021-06-06 MED ORDER — LORATADINE 10 MG PO TABS
10.0000 mg | ORAL_TABLET | Freq: Every day | ORAL | Status: DC
  Administered 2021-06-06 – 2021-06-08 (×3): 10 mg via ORAL
  Filled 2021-06-06 (×3): qty 1

## 2021-06-06 MED ORDER — OMALIZUMAB 75 MG/0.5ML ~~LOC~~ SOSY: 375.0000 mg | PREFILLED_SYRINGE | SUBCUTANEOUS | Status: DC

## 2021-06-06 MED ORDER — NALOXONE HCL 4 MG/0.1ML NA LIQD
4.0000 mg | Freq: Every day | NASAL | Status: DC | PRN
  Filled 2021-06-06: qty 8

## 2021-06-06 MED ORDER — RIMEGEPANT SULFATE 75 MG PO TBDP: 75.0000 mg | ORAL_TABLET | Freq: Every day | ORAL | Status: DC | PRN

## 2021-06-06 MED ORDER — SPIRONOLACTONE 25 MG PO TABS
25.0000 mg | ORAL_TABLET | Freq: Every day | ORAL | Status: DC
  Administered 2021-06-06 – 2021-06-08 (×3): 25 mg via ORAL
  Filled 2021-06-06 (×3): qty 1

## 2021-06-06 MED ORDER — LOPERAMIDE HCL 2 MG PO CAPS: 2.0000 mg | ORAL_CAPSULE | Freq: Four times a day (QID) | ORAL | Status: DC | PRN

## 2021-06-06 MED ORDER — HYDROXYZINE HCL 50 MG PO TABS
50.0000 mg | ORAL_TABLET | ORAL | Status: DC
  Administered 2021-06-06 – 2021-06-08 (×4): 50 mg via ORAL
  Filled 2021-06-06 (×5): qty 1

## 2021-06-06 MED ORDER — MOMETASONE FURO-FORMOTEROL FUM 200-5 MCG/ACT IN AERO
2.0000 | INHALATION_SPRAY | Freq: Two times a day (BID) | RESPIRATORY_TRACT | Status: DC
  Administered 2021-06-06 – 2021-06-08 (×5): 2 via RESPIRATORY_TRACT
  Filled 2021-06-06: qty 8.8

## 2021-06-06 MED ORDER — ONDANSETRON 4 MG PO TBDP: 8.0000 mg | ORAL_TABLET | Freq: Three times a day (TID) | ORAL | Status: DC | PRN

## 2021-06-06 MED ORDER — CETIRIZINE HCL 1 MG/ML PO SOLN: 20.0000 mg | ORAL | Status: DC | PRN

## 2021-06-06 MED ORDER — AZELASTINE HCL 0.1 % NA SOLN
2.0000 | Freq: Every day | NASAL | Status: DC | PRN
  Filled 2021-06-06: qty 30

## 2021-06-06 MED ORDER — TIZANIDINE HCL 2 MG PO TABS
4.0000 mg | ORAL_TABLET | Freq: Every day | ORAL | Status: DC
  Administered 2021-06-06 – 2021-06-08 (×3): 4 mg via ORAL
  Filled 2021-06-06: qty 1
  Filled 2021-06-06 (×2): qty 2

## 2021-06-06 MED ORDER — CROMOLYN SODIUM 100 MG/5ML PO CONC
200.0000 mg | Freq: Four times a day (QID) | ORAL | Status: DC
  Administered 2021-06-07 – 2021-06-08 (×6): 200 mg via ORAL
  Filled 2021-06-06 (×9): qty 10

## 2021-06-06 MED ORDER — FLUTICASONE PROPIONATE 50 MCG/ACT NA SUSP
1.0000 | Freq: Every day | NASAL | Status: DC
  Administered 2021-06-06 – 2021-06-08 (×3): 1 via NASAL
  Filled 2021-06-06: qty 16

## 2021-06-06 MED ORDER — LISDEXAMFETAMINE DIMESYLATE 50 MG PO CAPS: 50.0000 mg | ORAL_CAPSULE | Freq: Every day | ORAL | Status: DC

## 2021-06-06 MED ORDER — GABAPENTIN 300 MG PO CAPS
600.0000 mg | ORAL_CAPSULE | Freq: Three times a day (TID) | ORAL | Status: DC
  Administered 2021-06-06 – 2021-06-08 (×7): 600 mg via ORAL
  Filled 2021-06-06 (×7): qty 2

## 2021-06-06 MED ORDER — ALBUTEROL SULFATE (2.5 MG/3ML) 0.083% IN NEBU: 2.5000 mg | INHALATION_SOLUTION | Freq: Four times a day (QID) | RESPIRATORY_TRACT | Status: DC | PRN

## 2021-06-06 MED ORDER — LISDEXAMFETAMINE DIMESYLATE 30 MG PO CAPS
30.0000 mg | ORAL_CAPSULE | Freq: Every day | ORAL | Status: DC
  Administered 2021-06-06: 30 mg via ORAL
  Administered 2021-06-07: 20 mg via ORAL
  Administered 2021-06-08: 30 mg via ORAL
  Filled 2021-06-06 (×3): qty 1

## 2021-06-06 MED ORDER — DEXTROSE-NACL 5-0.45 % IV SOLN: INTRAVENOUS | Status: DC

## 2021-06-06 MED ORDER — DIPHENHYDRAMINE HCL 50 MG/ML IJ SOLN
INTRAMUSCULAR | Status: AC
  Filled 2021-06-06: qty 1

## 2021-06-06 MED ORDER — CLONIDINE HCL 0.1 MG PO TABS: 0.1000 mg | ORAL_TABLET | Freq: Every day | ORAL | Status: DC | PRN

## 2021-06-06 MED ORDER — FAMOTIDINE IN NACL 20-0.9 MG/50ML-% IV SOLN
20.0000 mg | Freq: Four times a day (QID) | INTRAVENOUS | Status: DC
  Administered 2021-06-06 – 2021-06-08 (×6): 20 mg via INTRAVENOUS
  Filled 2021-06-06 (×11): qty 50

## 2021-06-06 MED ORDER — MUPIROCIN 2 % EX OINT
1.0000 "application " | TOPICAL_OINTMENT | Freq: Every day | CUTANEOUS | Status: DC | PRN
  Administered 2021-06-07: 1 via TOPICAL
  Filled 2021-06-06: qty 22

## 2021-06-06 MED ORDER — EPINEPHRINE PF 1 MG/ML IJ SOLN: 0.3000 mL | INTRAMUSCULAR | Status: DC | PRN

## 2021-06-06 MED ORDER — SODIUM CHLORIDE 0.9 % IV SOLN
25.0000 mg | Freq: Three times a day (TID) | INTRAVENOUS | Status: DC
  Administered 2021-06-06 – 2021-06-08 (×6): 25 mg via INTRAVENOUS
  Filled 2021-06-06 (×9): qty 1

## 2021-06-06 MED ORDER — VITAMIN D 25 MCG (1000 UNIT) PO TABS
10000.0000 [IU] | ORAL_TABLET | Freq: Every day | ORAL | Status: DC
  Administered 2021-06-06 – 2021-06-08 (×3): 10000 [IU] via ORAL
  Filled 2021-06-06 (×3): qty 10

## 2021-06-06 MED ORDER — HYDROCODONE-ACETAMINOPHEN 5-325 MG PO TABS
1.0000 | ORAL_TABLET | ORAL | Status: DC | PRN
Start: 2021-06-06 — End: 2021-06-08
  Administered 2021-06-07 (×2): 1 via ORAL
  Filled 2021-06-06 (×2): qty 1

## 2021-06-06 MED ORDER — TRAZODONE HCL 50 MG PO TABS: 25.0000 mg | ORAL_TABLET | Freq: Every evening | ORAL | Status: DC | PRN

## 2021-06-06 MED ORDER — LISDEXAMFETAMINE DIMESYLATE 20 MG PO CAPS
20.0000 mg | ORAL_CAPSULE | Freq: Every day | ORAL | Status: DC
  Administered 2021-06-06 – 2021-06-08 (×3): 20 mg via ORAL
  Filled 2021-06-06 (×3): qty 1

## 2021-06-06 MED ORDER — CLONAZEPAM 0.5 MG PO TABS
1.0000 mg | ORAL_TABLET | Freq: Four times a day (QID) | ORAL | Status: DC
  Administered 2021-06-06 – 2021-06-08 (×8): 1 mg via ORAL
  Filled 2021-06-06 (×8): qty 2

## 2021-06-06 MED ORDER — ZINC SULFATE 220 (50 ZN) MG PO CAPS: 220.0000 mg | ORAL_CAPSULE | Freq: Every day | ORAL | Status: DC | PRN

## 2021-06-06 MED ORDER — HEPARIN SODIUM (PORCINE) 5000 UNIT/ML IJ SOLN
5000.0000 [IU] | Freq: Three times a day (TID) | INTRAMUSCULAR | Status: DC
  Administered 2021-06-06: 5000 [IU] via SUBCUTANEOUS
  Filled 2021-06-06 (×3): qty 1

## 2021-06-06 MED ORDER — SODIUM CHLORIDE 0.9 % IV SOLN
50.0000 mg | Freq: Three times a day (TID) | INTRAVENOUS | Status: DC | PRN
  Administered 2021-06-06 – 2021-06-07 (×2): 50 mg via INTRAVENOUS
  Filled 2021-06-06 (×3): qty 1

## 2021-06-06 MED ORDER — CEFDINIR 300 MG PO CAPS
300.0000 mg | ORAL_CAPSULE | Freq: Two times a day (BID) | ORAL | Status: DC
  Administered 2021-06-06 – 2021-06-08 (×5): 300 mg via ORAL
  Filled 2021-06-06 (×6): qty 1

## 2021-06-06 MED ORDER — SUMATRIPTAN SUCCINATE 50 MG PO TABS
50.0000 mg | ORAL_TABLET | ORAL | Status: DC | PRN
  Filled 2021-06-06: qty 1

## 2021-06-06 MED ORDER — DOCUSATE SODIUM 100 MG PO CAPS
100.0000 mg | ORAL_CAPSULE | Freq: Two times a day (BID) | ORAL | Status: DC
  Administered 2021-06-07: 100 mg via ORAL
  Filled 2021-06-06 (×4): qty 1

## 2021-06-06 MED ORDER — HYDROCORTISONE SOD SUC (PF) 100 MG IJ SOLR
100.0000 mg | Freq: Once | INTRAMUSCULAR | Status: AC
  Administered 2021-06-06: 100 mg via INTRAVENOUS
  Filled 2021-06-06: qty 2

## 2021-06-06 MED ORDER — LACTATED RINGERS IV SOLN
1000.0000 mL | Freq: Two times a day (BID) | INTRAVENOUS | Status: DC
  Administered 2021-06-06 – 2021-06-08 (×4): 1000 mL via INTRAVENOUS

## 2021-06-06 MED ORDER — KETOTIFEN FUMARATE 0.025 % OP SOLN
1.0000 [drp] | Freq: Two times a day (BID) | OPHTHALMIC | Status: DC
  Administered 2021-06-06 – 2021-06-08 (×5): 1 [drp] via OPHTHALMIC
  Filled 2021-06-06: qty 5

## 2021-06-06 MED ORDER — POTASSIUM CHLORIDE CRYS ER 10 MEQ PO TBCR
20.0000 meq | EXTENDED_RELEASE_TABLET | Freq: Two times a day (BID) | ORAL | Status: DC
  Administered 2021-06-06 – 2021-06-08 (×5): 20 meq via ORAL
  Filled 2021-06-06 (×7): qty 2

## 2021-06-06 MED ORDER — LORATADINE 10 MG PO TABS
10.0000 mg | ORAL_TABLET | Freq: Every day | ORAL | Status: DC
  Administered 2021-06-07 – 2021-06-08 (×2): 10 mg via ORAL
  Filled 2021-06-06 (×2): qty 1

## 2021-06-06 MED ORDER — FAMOTIDINE 200 MG/20ML IV SOLN: 20.0000 mg | Freq: Four times a day (QID) | INTRAVENOUS | Status: DC

## 2021-06-06 MED ORDER — ASCORBIC ACID 500 MG PO TABS: 1000.0000 mg | ORAL_TABLET | Freq: Every day | ORAL | Status: DC | PRN

## 2021-06-06 MED ORDER — LACTATED RINGERS IV BOLUS
1000.0000 mL | Freq: Once | INTRAVENOUS | Status: AC
  Administered 2021-06-06: 1000 mL via INTRAVENOUS

## 2021-06-06 MED ORDER — SODIUM CHLORIDE 0.9 % IV SOLN
25.0000 mg | Freq: Once | INTRAVENOUS | Status: AC
  Administered 2021-06-06: 25 mg via INTRAVENOUS
  Filled 2021-06-06: qty 1

## 2021-06-06 MED ORDER — IPRATROPIUM-ALBUTEROL 0.5-2.5 (3) MG/3ML IN SOLN: 3.0000 mL | RESPIRATORY_TRACT | Status: DC | PRN

## 2021-06-06 MED ORDER — FLUDROCORTISONE ACETATE 0.1 MG PO TABS
0.1000 mg | ORAL_TABLET | Freq: Every day | ORAL | Status: DC
  Administered 2021-06-06 – 2021-06-08 (×3): 0.1 mg via ORAL
  Filled 2021-06-06 (×3): qty 1
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
## 2021-06-06 NOTE — ED Notes (Signed)
Provider at bedside
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
## 2021-06-06 NOTE — ED Notes (Signed)
ED Provider at bedside. 

## 2021-06-06 NOTE — ED Notes (Signed)
Pt states she took her Clonazepam 1 mg; pt is anxious and is requesting daily medications to be ordered.

## 2021-06-06 NOTE — Progress Notes (Signed)
PHARMACY - TOTAL PARENTERAL NUTRITION CONSULT NOTE   Indication:  MCAD  (medium-chain acyl-CoA dehydrogenase deficiency)  Assessment:  36 y.o. female with a past medical history medium-chain acyl-CoA dehydrogenase deficiency presents due to vascular access problems. Per chart review, patient was to have her central line replaced 2 days ago however missed her appointment. She notes that one of the catheters is leaking and the other catheter is blocked. Per discussion with patient she has not had her TPN for several months due to moving from [place] and needing to re-establish care in the area. IR is been consulted and states they cannot get the line done today. Will maintain on IVF until we are able to restart TPN.   Last known home TPN formula [employer] [phone]   Plan:  Start TPN once new line is placed, most likely 01/23 per discussion w/ provider Initiate MIVF while TPN initiation is pending Monitor TPN labs on Mon/Thurs and PRN  Thank you for allowing pharmacy to be a part of this patients care.  Ardyth Harps, PharmD Clinical Pharmacist

## 2021-06-06 NOTE — Progress Notes (Signed)
ED Request to IR for tunneled RIJ PICC placement under sedation. Existing PICC placed at OSH is currently malfunctioning. Patient was schedueld for PICC line  with sedation as outpatient on 1.19.23 but was a no show to that appointment. IR does not place PICC lines on the weekend. Epic message sent to ED Attending.   Appointment will need to be coordinated with IR scheduler.  Please call [phone] with any questions or concerns .

## 2021-06-06 NOTE — ED Notes (Addendum)
Pt calls out stating she is having an allergic reaction secondary to her MAST syndrome. Pt a/ox4, NAD on entering room. Pt is standing up near a recliner and the bed. Pt nearly falls secondary to a CAM Boot she is wearing. Pt is encouraged to lay back

## 2021-06-06 NOTE — ED Provider Notes (Addendum)
°  Physical Exam  BP (!) 129/94 (BP Location: Right Arm)    Pulse 91    Temp 98 F (36.7 C) (Oral)    Resp 18    SpO2 100%   Physical Exam  Procedures  Procedures  ED Course / MDM   Clinical Course as of 06/06/21 0901  Fri Jun 05, 2021  2319 Discussed with patient and spouse at bedside regarding hospital admission for catheter management and access.  Patient agreeable at this time. [SB]  Sat Jun 06, 2021  0000 Consult with Dr. Posey Pronto who recommends IR consult without medical admission. [SB]  0020 Discussion with Dr. Posey Pronto to recommend admission due to TPN needs and replacement of central line. Dr. Posey Pronto again noted that patient can be managed with IR consult in the ED and social work management follow up. [SB]  0025 Case signed out to Attending, Dr. Christy Gentles. [SB]  [phone] Discussed with IR.  They will take care of catheter in the morning [DW]    Clinical Course User Index [DW] Ripley Fraise, MD [SB] Blue, Soijett A, PA-C   Medical Decision Making Amount and/or Complexity of Data Reviewed Labs: ordered. Radiology: ordered.  Risk OTC drugs. Prescription drug management. Decision regarding hospitalization.   Received patient in signout.  Was a signout of a signout.  Needs IV access for home medications.  no active access at this time.  Initial plan was for IR to place a line on Thursday.  However patient missed appointment.  Unable to get Till later next week.  Had been sent to the ER for admission.  Cannot get her medications without the access.  Does not have home health to deliver medicines also.  Previous provider had discussed with hospitalist who stated that the line she replaced to the ER.  IR is been consulted and now states they cannot get the line done today.  With no good long-term access and no ability to get medicines at home patient will require admission to the hospital.  Will discuss with hospitalist again  Shortly after 9:00 discussed with Dr. Linda Hedges, who will admit  patient     Davonna Belling, MD 06/06/21 0017    Davonna Belling, MD 06/06/21 1538

## 2021-06-07 DIAGNOSIS — E2740 Unspecified adrenocortical insufficiency: Secondary | ICD-10-CM | POA: Diagnosis not present

## 2021-06-07 LAB — CBC
HCT: 30.3 % — ABNORMAL LOW (ref 36.0–46.0)
Hemoglobin: 9.6 g/dL — ABNORMAL LOW (ref 12.0–15.0)
MCH: 27.3 pg (ref 26.0–34.0)
MCHC: 31.7 g/dL (ref 30.0–36.0)
MCV: 86.1 fL (ref 80.0–100.0)
Platelets: 289 10*3/uL (ref 150–400)
RBC: 3.52 MIL/uL — ABNORMAL LOW (ref 3.87–5.11)
RDW: 16.1 % — ABNORMAL HIGH (ref 11.5–15.5)
WBC: 8.6 10*3/uL (ref 4.0–10.5)
nRBC: 0 % (ref 0.0–0.2)

## 2021-06-07 LAB — HIV ANTIBODY (ROUTINE TESTING W REFLEX): HIV Screen 4th Generation wRfx: NONREACTIVE

## 2021-06-07 MED ORDER — SODIUM CHLORIDE 0.9% FLUSH: 10.0000 mL | INTRAVENOUS | Status: DC | PRN

## 2021-06-07 MED ORDER — SODIUM CHLORIDE 0.9% FLUSH
10.0000 mL | Freq: Two times a day (BID) | INTRAVENOUS | Status: DC
  Administered 2021-06-07: 10 mL

## 2021-06-07 NOTE — Consult Note (Signed)
Chief Complaint: Patient was seen in consultation today for  Chief Complaint  Patient presents with   Fall   Vascular Access Problem    Referring Physician(s): Dr. Doristine Bosworth  Supervising Physician: Aletta Edouard  Patient Status: [facility] - In-pt  History of Present Illness: Melanie Graves is a 36 y.o. female with a medical history significant for MCAD, mast cell activation syndrome, chronic Lyme disease and attention deficit disorder. 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 She has a central venous catheter (placed at an outside facility) for supplemental TPN and daily IV hydration.   The catheter has recently become unusable and the patient was scheduled for line replacement with [facility] 06/04/21 but did not show up for the appointment. She presented to the [facility] ED 06/05/21 for assistance with catheter replacement.  Interventional Radiology has been asked to evaluate this patient for an image-guided tunneled central line placement/exchange with sedation. Case reviewed and procedure approved by Dr. Kathlene Cote.   Past Medical History:  Diagnosis Date   ADD (attention deficit disorder)    Adrenal cortical hypofunction (HCC)    allergic rhinnitis    Arthritis    Asthma    Immune deficiency disorder ([place])    Lyme disease    MCAD (medium-chain acyl-CoA dehydrogenase deficiency) (HCC)    solar urticaria    Thyroid disease     Past Surgical History:  Procedure Laterality Date   ABDOMINAL SURGERY     ballon sinus plasty      Allergies: Beta adrenergic blockers, Chlorhexidine, Fluconazole, and Risperdal [risperidone]  Medications: Prior to Admission medications   Medication Sig Start Date End Date Taking? Authorizing Provider  albuterol (VENTOLIN HFA) 108 (90 Base) MCG/ACT inhaler Inhale 1-2 puffs into the lungs every 6 (six) hours as needed for wheezing or shortness of breath.   Yes [provider]  amoxicillin-clavulanate (AUGMENTIN)  875-125 MG tablet Take 1 tablet by mouth See admin instructions. Bid x 14 days 05/18/21  Yes [provider]  ascorbic acid (VITAMIN C) 1000 MG tablet Take 1,000 mg by mouth daily as needed (immune support).   Yes [provider]  azelastine (ASTELIN) 0.1 % nasal spray Place 2 sprays into both nostrils daily as needed for rhinitis or allergies. 05/18/21  Yes [provider]  CATHFLO ACTIVASE 2 MG injection 2 mg by Intracatheter route daily as needed for open catheter. 03/01/21  Yes [provider]  cefUROXime (CEFTIN) 500 MG tablet Take 500 mg by mouth See admin instructions. Bid x 10 days 05/18/21  Yes [provider]  cetirizine (ZYRTEC) 10 MG tablet Take 10 mg by mouth in the morning and at bedtime. 01/19/19  Yes [provider]  cetirizine HCl (ZYRTEC) 1 MG/ML solution Take 20 mg by mouth as needed (itching/ allergic reactions).   Yes [provider]  Cholecalciferol (VITAMIN D3) 125 MCG (5000 UT) CAPS Take 10,000 Units by mouth daily.   Yes [provider]  clemastine (TAVIST) 2.68 MG TABS tablet Take 2.68 mg by mouth in the morning and at bedtime.   Yes [provider]  clonazePAM (KLONOPIN) 1 MG tablet Take 1 mg by mouth 4 (four) times daily.   Yes [provider]  cloNIDine (CATAPRES) 0.1 MG tablet Take 0.1 mg by mouth daily as needed (high blood pressure). 04/14/21  Yes [provider]  cromolyn (GASTROCROM) 100 MG/5ML solution Take 200 mg by mouth 4 (four) times daily.   Yes [provider]  diphenhydrAMINE 50  mg in sodium chloride 0.9 % 50 mL Inject 50 mg into the vein See admin instructions. 3-4 times daily   Yes [provider]  EPINEPHrine (ADRENALIN) 1 MG/ML SOLN Inject 0.3 mLs into the muscle as needed for anaphylaxis.   Yes [provider]  EQ ANTI-DIARRHEAL 2 MG tablet Take 2 mg by mouth 4 (four) times daily as needed for diarrhea or loose stools. 04/12/21  Yes  [provider]  famotidine (PEPCID) 40 MG tablet Take 40 mg by mouth daily as needed (reflux/to prevent anaphylaxis).   Yes [provider]  FAMOTIDINE IV Inject 20 mg into the vein 4 (four) times daily.   Yes [provider]  fludrocortisone (FLORINEF) 0.1 MG tablet Take 0.1 mg by mouth daily.   Yes [provider]  gabapentin (NEURONTIN) 600 MG tablet Take 600 mg by mouth 3 (three) times daily. 05/12/21  Yes [provider]  HYDROcodone-acetaminophen (NORCO/VICODIN) 5-325 MG tablet Take 1 tablet by mouth every 4 (four) hours as needed. Patient taking differently: Take 1 tablet by mouth every 4 (four) hours as needed for moderate pain. 04/26/21  Yes Pollina, Gwenyth Allegra, MD  Hydrocortisone Sod Succinate (SOLU-CORTEF IJ) Inject 100 mg as directed See admin instructions. 2-4 times daily   Yes [provider]  hydrOXYzine (VISTARIL) 25 MG capsule Take 50-100 mg by mouth in the morning and at bedtime. 04/12/21  Yes [provider]  ipratropium-albuterol (DUONEB) 0.5-2.5 (3) MG/3ML SOLN Take 3 mLs by nebulization every 4 (four) hours as needed (shortness of breath).   Yes [provider]  ketotifen (ZADITOR) 0.025 % ophthalmic solution Place 1 drop into both eyes in the morning and at bedtime.   Yes [provider]  lactated ringers infusion Inject 1,000 mLs into the vein in the morning and at bedtime.   Yes [provider]  mupirocin ointment (BACTROBAN) 2 % Apply 1 application topically daily as needed (open wounds). 05/01/21  Yes [provider]  naloxone (NARCAN) nasal spray 4 mg/0.1 mL Place 4 mg into the nose daily as needed (opoid overdose). 02/03/21  Yes [provider]  ondansetron (ZOFRAN-ODT) 8 MG disintegrating tablet Take 8 mg by mouth every 8 (eight) hours as needed for nausea or vomiting.   Yes [provider]  potassium chloride (KLOR-CON) 10 MEQ tablet Take 20 mEq by  mouth 2 (two) times daily. 04/12/21  Yes [provider]  promethazine (PHENERGAN) 25 MG tablet Take 25 mg by mouth 2 (two) times daily as needed for vomiting or nausea.   Yes [provider]  promethazine 25 mg in sodium chloride 0.9 % 1,000 mL Inject 25 mg into the vein See admin instructions. 3-4 times daily   Yes [provider]  QNASL 80 MCG/ACT AERS Place 2 sprays into both nostrils in the morning and at bedtime. 05/18/21  Yes [provider]  Rimegepant Sulfate (NURTEC) 75 MG TBDP Take 75 mg by mouth daily as needed (migraine).   Yes [provider]  rizatriptan (MAXALT) 10 MG tablet Take 10 mg by mouth as needed for migraine. 06/23/16  Yes [provider]  Sodium Chloride Flush (NORMAL SALINE FLUSH) 0.9 % SOLN Inject 10 mLs into the vein See admin instructions. 3-4 times daily   Yes [provider]  spironolactone (ALDACTONE) 25 MG tablet Take 25 mg by mouth daily. 05/18/21  Yes [provider]  SYMBICORT 160-4.5 MCG/ACT inhaler Inhale 2 puffs into the lungs in the morning and at  bedtime. 05/18/21  Yes [provider]  tezepelumab-ekko (TEZSPIRE) 210 MG/1.91ML syringe Inject 210 mg into the skin every 30 (thirty) days.   Yes [provider]  tiZANidine (ZANAFLEX) 4 MG tablet Take 4 mg by mouth daily.   Yes [provider]  VYVANSE 50 MG capsule Take 50 mg by mouth daily. 05/29/21  Yes [provider]  Arvid Right 75 MG/0.5ML prefilled syringe Inject 375 mg into the skin every 14 (fourteen) days. 05/25/21  Yes [provider]  zinc gluconate 50 MG tablet Take 50 mg by mouth daily as needed (immune support). 01/04/21  Yes [provider]     Family History  Problem Relation Age of Onset   Renal cancer Mother    Osteoporosis Mother    Lumbar disc disease Mother    Skin cancer Father    Drug abuse Sister    Hepatitis C Brother    Heart disease Brother    Asthma Brother      Social History   Socioeconomic History   Marital status: Single    Spouse name: Not on file   Number of children: Not on file   Years of education: Not on file   Highest education level: Not on file  Occupational History   Not on file  Tobacco Use   Smoking status: Never   Smokeless tobacco: Never  Vaping Use   Vaping Use: Some days   Substances: Nicotine  Substance and Sexual Activity   Alcohol use: Never   Drug use: Yes    Types: Marijuana   Sexual activity: Yes  Other Topics Concern   Not on file  Social History Narrative   Not on file   Social Determinants of Health   Financial Resource Strain: Not on file  Food Insecurity: Not on file  Transportation Needs: Not on file  Physical Activity: Not on file  Stress: Not on file  Social Connections: Not on file    Review of Systems: A 12 point ROS discussed and pertinent positives are indicated in the HPI above.  All other systems are negative.  Review of Systems  Constitutional:  Positive for appetite change and fatigue.  Respiratory:  Negative for cough and shortness of breath.   Cardiovascular:  Positive for leg swelling. Negative for chest pain.  Gastrointestinal:  Negative for diarrhea, nausea and vomiting.  Skin:  Positive for rash.          Neurological:  Negative for headaches.   Vital Signs: BP 124/78 (BP Location: Left Arm)    Pulse (!) 103    Temp 98.2 F (36.8 C) (Oral)    Resp 18    SpO2 99%   Physical Exam Constitutional:      General: She is not in acute distress.    Appearance: She is not ill-appearing.  HENT:     Head:     Comments: Mild facial swelling/angioedema     Mouth/Throat:     Mouth: Mucous membranes are moist.     Pharynx: Oropharynx is clear.  Pulmonary:     Effort: Pulmonary effort is normal.  Skin:    Comments: Generalized bruises, blisters, other skin issues related to mast cell activation syndrome.  Neurological:     Mental Status: She is alert and oriented to  person, place, and time.    Imaging: DG Chest 2 View  Result Date: 06/05/2021 CLINICAL DATA:  Fall, right shoulder pain EXAM: CHEST - 2 VIEW COMPARISON:  None. FINDINGS: Right internal jugular  PICC line in place with the tip at the cavoatrial junction. Heart and mediastinal contours are within normal limits. No focal opacities or effusions. No acute bony abnormality. No visible rib fracture or pneumothorax. IMPRESSION: No active cardiopulmonary disease. Electronically Signed   By: Rolm Baptise M.D.   On: 06/05/2021 22:58   DG Shoulder Right  Result Date: 06/05/2021 CLINICAL DATA:  Fall EXAM: RIGHT SHOULDER - 2+ VIEW COMPARISON:  None. FINDINGS: There is no acute fracture or dislocation. Joint spaces are maintained. Soft tissue calcifications are seen inferior to the glenoid which may be related to old injury. Right-sided central venous catheter is partially visualized. IMPRESSION: 1. No acute fracture or dislocation. Electronically Signed   By: Ronney Asters M.D.   On: 06/05/2021 22:57   DG Ankle 2 Views Right  Result Date: 06/05/2021 CLINICAL DATA:  Fall, ankle pain EXAM: RIGHT ANKLE - 2 VIEW COMPARISON:  None. FINDINGS: Corticated bone density adjacent to the lateral malleolus felt to reflect old injury or secondary ossification center. Recommend correlation for pain in this area. No other evidence of acute fracture, subluxation or dislocation. IMPRESSION: Well corticated bone density adjacent to the lateral malleolus felt to reflect old injury or secondary ossification center. No visible acute fracture. Electronically Signed   By: Rolm Baptise M.D.   On: 06/05/2021 22:57   DG Foot 2 Views Right  Result Date: 06/05/2021 CLINICAL DATA:  Fall, right foot pain EXAM: RIGHT FOOT - 2 VIEW COMPARISON:  None. FINDINGS: There is no evidence of fracture or dislocation. There is no evidence of arthropathy or other focal bone abnormality. Soft tissues are unremarkable. IMPRESSION: Negative. Electronically  Signed   By: Rolm Baptise M.D.   On: 06/05/2021 22:56   DG Foot Complete Left  Result Date: 05/15/2021 X-ray of the left foot was obtained in clinic today and demonstrates a nondisplaced fracture at the base of the fifth metatarsal.  Compared to prior x-rays, there is been no interval displacement.  No callus formation is visualized.  No other injuries are noted.   Impression: Nondisplaced fracture of the base of the left fifth metatarsal.   Labs:  CBC: Recent Labs    06/05/21 2226 06/07/21 0450  WBC 12.8* 8.6  HGB 11.1* 9.6*  HCT 34.9* 30.3*  PLT 294 289    COAGS: No results for input(s): INR, APTT in the last 8760 hours.  BMP: Recent Labs    06/05/21 2226  NA 136  K 3.7  CL 103  CO2 24  GLUCOSE 100*  BUN <5*  CALCIUM 8.4*  CREATININE 0.59  GFRNONAA >60    LIVER FUNCTION TESTS: Recent Labs    06/05/21 2226  BILITOT 0.7  AST 25  ALT 23  ALKPHOS 45  PROT 6.0*  ALBUMIN 3.3*    TUMOR MARKERS: No results for input(s): AFPTM, CEA, CA199, CHROMGRNA in the last 8760 hours.  Assessment and Plan:  Mast cell activation disorder; requires TPN/hydration; malfunctioning central venous catheter: Salena Saner, 36 year old female, is tentatively scheduled 06/08/20 for an image-guided tunneled central venous catheter exchange with fentanyl only per patient request. She has a chlorhexadine allergy and stated that iodine was acceptable for skin cleansing.   Risks and benefits discussed with the patient including, but not limited to bleeding, infection and vascular injury.  All of the patient's questions were answered, patient is agreeable to proceed. She will be NPO at midnight.   Consent signed and in IR.   Thank you for this interesting consult.  I  greatly enjoyed meeting Kianna Billet and look forward to participating in their care.  A copy of this report was sent to the requesting provider on this date.  Electronically Signed: Soyla Dryer,  AGACNP-BC [id number] 06/07/2021, 9:43 AM   I spent a total of 20 Minutes    in face to face in clinical consultation, greater than 50% of which was counseling/coordinating care for tunneled central catheter placement/exchange.

## 2021-06-07 NOTE — Progress Notes (Addendum)
PROGRESS NOTE    [name]  VQQ:[id number] DOB: [birth year] DOA: 06/05/2021 PCP: Pcp, No   Brief Narrative:  HPI: Melanie Graves is a 36 y.o. female with medical history significant of very complex patient with MCAD, mast cell activation syndrome, adrenal insufficiency, food allergy, angio-edema, chonic Lyme's disease,POTS, ADD and more. She has been followed by Dr. Marin Roberts in [place] - allergy/immunology. Currently does not have PCP. She has seen various ID specialists,across several states for management of chronic Lyme's and reports she took IV ceftriaxone for 1 year. She suffers malnutrition due to food allergies and angio-edema issues and has been taking supplemental TPN last does several weeks to months ago. She requires daily IV hydration. For these issue she has central venous catheter. She lost access recently. She was scheduled for tunneled line replacement with [facility] 06/04/30 but missed that appointment. She has a known fracture, nondisplaced, of the left 5th metatarsal. She had a UTI in December '22 with e.coli pan sensitive but she never completed abx/  She presents to [facility] ED for assistance with IV access and continuation of her complicated med table. IR does not place elective central lines over the weekend.   ED Course: T98  129, 64  HR 101  R 18. Cmet nl, Troponin 5, WBC 12.8 80/12/6, Hgb 11.1. Rt foot and anle x-ray negative for fracture. CXR NAD. TRH called to admit for continued treatment  Assessment & Plan:   Principal Problem:   Adrenal cortical hypofunction (HCC) Active Problems:   Mast cell activation syndrome (HCC)   POTS (postural orthostatic tachycardia syndrome)   Chronic hypokalemia   ADD (attention deficit disorder)   Fracture of 5th metatarsal  Need for central catheter IV access: Patient has needs of long-term IV meds and fluids as well as TPN via central catheter which is malfunctioning now he needs to be replaced by IR but unfortunately IR is not  available over the weekend to do this.  I have placed a consult but according to H&P, IR is already aware and will make that happen tomorrow.  Patient has multiple medical issues includes adrenal cortical hypofunction/Mast syndrome/ADD/fracture of fifth metatarsal/POTS: All of them are stable.  Continue home medications.  Addendum: I received a message from patient's primary nurse that patient was presuming that she was going to have allergic reaction as she had noted some changes in the skin and that she demanded that MD see her at the bedside.  Per nurse, there were no skin changes observed by the nurse.  I have verified that patient's vitals were completely fine and she was hemodynamically stable and she already had several as needed medications for allergic reactions.  I received another message around 215 that patient is now refusing to take her medications until MD sees her at the bedside.  I went to see the patient at the bedside.  She was showing me her skin having allergic reactions however on my examination, I did not find anything different than how she was this morning but she continues to believe that she was going to have allergic reaction and was demanding EpiPen.  EpiPen was ordered but that is only for anaphylaxis and patient was not having anaphylaxis.  Patient has several entire allergic medications including H2 blockers, H1 blockers as well as Benadryl.  Patient is extremely obsessed with her allergies.  She also claimed that she never demanded that [place] is here at the bedside.  Although patient's primary nurse had reiterated right in front of  her.  Patient hemodynamically stable again.  DVT prophylaxis: heparin injection 5,000 Units Start: 06/06/21 1630   Code Status: Full Code  Family Communication: Boyfriend present at bedside.  Plan of care discussed with patient in length and he/she verbalized understanding and agreed with it.  Status is: Inpatient  Remains inpatient appropriate  because: Needs central catheter placed by IR.   Estimated body mass index is 24.27 kg/m as calculated from the following:   Height as of 05/04/21: 5\' 7"  (1.702 m).   Weight as of 05/04/21: 70.3 kg.    Nutritional Assessment: There is no height or weight on file to calculate BMI.. Seen by dietician.  I agree with the assessment and plan as outlined below: Nutrition Status:        . Skin Assessment: I have examined the patient's skin and I agree with the wound assessment as performed by the wound care RN as outlined below:    Consultants:  IR  Procedures:  None  Antimicrobials:  Anti-infectives (From admission, onward)    Start     Dose/Rate Route Frequency Ordered Stop   06/06/21 1530  cefdinir (OMNICEF) capsule 300 mg        300 mg Oral Every 12 hours 06/06/21 1521           Subjective: Seen and examined.  Patient's boyfriend at the bedside.  Patient is extremely obsessed with her mast syndrome and her chronic medical issues.  Patient keeps on talking about them repeatedly and continues to ask multiple questions although it appears that this is all chronic.  Patient appears to have severe anxiety about her medical issues.  Objective: Vitals:   06/06/21 2040 06/06/21 2150 06/07/21 0606 06/07/21 0951  BP:  131/87 124/78 116/78  Pulse: (!) 116 (!) 102 (!) 103 95  Resp: 20 18 18 17   Temp:  97.8 F (36.6 C) 98.2 F (36.8 C) 97.7 F (36.5 C)  TempSrc:  Oral Oral Oral  SpO2: 95% 99% 99% 100%    Intake/Output Summary (Last 24 hours) at 06/07/2021 1038 Last data filed at 06/07/2021 0200 Gross per 24 hour  Intake 1327.13 ml  Output 1200 ml  Net 127.13 ml   There were no vitals filed for this visit.  Examination:  General exam: Appears anxious Respiratory system: Clear to auscultation. Respiratory effort normal. Cardiovascular system: S1 & S2 heard, RRR. No JVD, murmurs, rubs, gallops or clicks. No pedal edema. Gastrointestinal system: Abdomen is nondistended,  soft and nontender. No organomegaly or masses felt. Normal bowel sounds heard. Central nervous system: Alert and oriented. No focal neurological deficits. Extremities: Symmetric 5 x 5 power. Skin: No rashes, lesions or ulcers Psychiatry: Judgement and insight appear poor, mood & affect anxious   Data Reviewed: I have personally reviewed following labs and imaging studies  CBC: Recent Labs  Lab 06/05/21 2226 06/07/21 0450  WBC 12.8* 8.6  NEUTROABS 10.2*  --   HGB 11.1* 9.6*  HCT 34.9* 30.3*  MCV 85.1 86.1  PLT 294 035   Basic Metabolic Panel: Recent Labs  Lab 06/05/21 2226 06/06/21 0437  NA 136  --   K 3.7  --   CL 103  --   CO2 24  --   GLUCOSE 100*  --   BUN <5*  --   CREATININE 0.59  --   CALCIUM 8.4*  --   MG  --  2.4  PHOS  --  3.0   GFR: CrCl cannot be calculated (Unknown ideal weight.). Liver  Function Tests: Recent Labs  Lab 06/05/21 2226  AST 25  ALT 23  ALKPHOS 45  BILITOT 0.7  PROT 6.0*  ALBUMIN 3.3*   No results for input(s): LIPASE, AMYLASE in the last 168 hours. No results for input(s): AMMONIA in the last 168 hours. Coagulation Profile: No results for input(s): INR, PROTIME in the last 168 hours. Cardiac Enzymes: No results for input(s): CKTOTAL, CKMB, CKMBINDEX, TROPONINI in the last 168 hours. BNP (last 3 results) No results for input(s): PROBNP in the last 8760 hours. HbA1C: No results for input(s): HGBA1C in the last 72 hours. CBG: Recent Labs  Lab 06/06/21 0159 06/06/21 2157  GLUCAP 123* 95   Lipid Profile: Recent Labs    06/06/21 0437  TRIG 36   Thyroid Function Tests: No results for input(s): TSH, T4TOTAL, FREET4, T3FREE, THYROIDAB in the last 72 hours. Anemia Panel: No results for input(s): VITAMINB12, FOLATE, FERRITIN, TIBC, IRON, RETICCTPCT in the last 72 hours. Sepsis Labs: No results for input(s): PROCALCITON, LATICACIDVEN in the last 168 hours.  No results found for this or any previous visit (from the past 240  hour(s)).   Radiology Studies: DG Chest 2 View  Result Date: 06/05/2021 CLINICAL DATA:  Fall, right shoulder pain EXAM: CHEST - 2 VIEW COMPARISON:  None. FINDINGS: Right internal jugular PICC line in place with the tip at the cavoatrial junction. Heart and mediastinal contours are within normal limits. No focal opacities or effusions. No acute bony abnormality. No visible rib fracture or pneumothorax. IMPRESSION: No active cardiopulmonary disease. Electronically Signed   By: Rolm Baptise M.D.   On: 06/05/2021 22:58   DG Shoulder Right  Result Date: 06/05/2021 CLINICAL DATA:  Fall EXAM: RIGHT SHOULDER - 2+ VIEW COMPARISON:  None. FINDINGS: There is no acute fracture or dislocation. Joint spaces are maintained. Soft tissue calcifications are seen inferior to the glenoid which may be related to old injury. Right-sided central venous catheter is partially visualized. IMPRESSION: 1. No acute fracture or dislocation. Electronically Signed   By: Ronney Asters M.D.   On: 06/05/2021 22:57   DG Ankle 2 Views Right  Result Date: 06/05/2021 CLINICAL DATA:  Fall, ankle pain EXAM: RIGHT ANKLE - 2 VIEW COMPARISON:  None. FINDINGS: Corticated bone density adjacent to the lateral malleolus felt to reflect old injury or secondary ossification center. Recommend correlation for pain in this area. No other evidence of acute fracture, subluxation or dislocation. IMPRESSION: Well corticated bone density adjacent to the lateral malleolus felt to reflect old injury or secondary ossification center. No visible acute fracture. Electronically Signed   By: Rolm Baptise M.D.   On: 06/05/2021 22:57   DG Foot 2 Views Right  Result Date: 06/05/2021 CLINICAL DATA:  Fall, right foot pain EXAM: RIGHT FOOT - 2 VIEW COMPARISON:  None. FINDINGS: There is no evidence of fracture or dislocation. There is no evidence of arthropathy or other focal bone abnormality. Soft tissues are unremarkable. IMPRESSION: Negative. Electronically Signed    By: Rolm Baptise M.D.   On: 06/05/2021 22:56    Scheduled Meds:  cefdinir  300 mg Oral Q12H   cholecalciferol  10,000 Units Oral Daily   clonazePAM  1 mg Oral QID   cromolyn  200 mg Oral QID   docusate sodium  100 mg Oral BID   fludrocortisone  0.1 mg Oral Daily   fluticasone  1 spray Each Nare Daily   gabapentin  600 mg Oral TID   heparin  5,000 Units Subcutaneous Q8H  hydrOXYzine  50 mg Oral BH-qamhs   ketotifen  1 drop Both Eyes BID   lisdexamfetamine  30 mg Oral Daily   And   lisdexamfetamine  20 mg Oral Daily   loratadine  10 mg Oral Daily   loratadine  10 mg Oral Daily   mometasone-formoterol  2 puff Inhalation BID   potassium chloride  20 mEq Oral BID   sodium chloride flush  10-40 mL Intracatheter Q12H   spironolactone  25 mg Oral Daily   tiZANidine  4 mg Oral Daily   Continuous Infusions:  dextrose 5 % and 0.45% NaCl 100 mL/hr at 06/07/21 0631   diphenhydrAMINE (BENADRYL) IVPB(SICKLE CELL ONLY) Stopped (06/06/21 1950)   famotidine (PEPCID) IV 20 mg (06/07/21 0957)   lactated ringers 1,000 mL (06/07/21 0959)   promethazine (PHENERGAN) IV infusion (for hyperemesis) 25 mg (06/06/21 2156)     LOS: 1 day   Time spent: 31 minutes  Darliss Cheney, MD [facility]  06/07/2021, 10:38 AM  Please page via [facility] and do not message via secure chat for anything urgent. Secure chat can be used for anything non urgent.  How to contact the [facility] Attending or Consulting provider [place] or covering provider during after hours [place], for this patient?  Check the care team in [facility] and look for a) attending/consulting TR[facility] provider listed and b) the [facility] team listed. Page or secure chat [phone]. Log into [email or web address] and use [place]'s universal password to access. If you do not have the password, please contact the hospital operator. Locate the [facility] provider you are looking for under [facility] and page to a number that you can be directly reached. If you still have  difficulty reaching the provider, please page the [facility] (Director on Call) for the Hospitalists listed on amion for assistance.

## 2021-06-07 NOTE — Progress Notes (Signed)
PHARMACY - TOTAL PARENTERAL NUTRITION CONSULT NOTE   Indication:  MCAD    Assessment:  36 y.o. female with a past medical history of MCAD presents due to vascular access problems. Per chart review, patient was to have her central line replaced 2 days ago however missed her appointment. She notes that one of the catheters is leaking and the other catheter is blocked. Per discussion with patient she has not had her TPN for several months due to moving from [place] and needing to re-establish care in the area. IR is consulted and states line placement will occur 01/23. Will maintain on IVF until we are able to restart TPN.   Last known home TPN formula IngenioRx [phone]   Plan:  Start TPN once new line is placed, now confirmed for 01/23 per IR Continue MIVF (D5-1/2NS) while TPN initiation is pending Monitor TPN labs on Mon/Thurs and PRN  Thank you for allowing pharmacy to be a part of this patients care.  Ardyth Harps, PharmD Clinical Pharmacist

## 2021-06-07 NOTE — Progress Notes (Signed)
Patient complained of possibly starting to have an allergic reaction. Not sure what could have caused this feeling. Patient states that legs are starting to get very red and swollen. I assessed legs and to me they look the same as when assessed early this morning. Made Dr Doristine Bosworth aware of patients concerns. MD stated that he would come see patient soon.

## 2021-06-08 ENCOUNTER — Inpatient Hospital Stay (HOSPITAL_COMMUNITY): Payer: Medicaid Other

## 2021-06-08 DIAGNOSIS — T82898A Other specified complication of vascular prosthetic devices, implants and grafts, initial encounter: Secondary | ICD-10-CM

## 2021-06-08 DIAGNOSIS — E2740 Unspecified adrenocortical insufficiency: Secondary | ICD-10-CM | POA: Diagnosis not present

## 2021-06-08 HISTORY — PX: IR FLUORO GUIDE CV LINE RIGHT: IMG2283

## 2021-06-08 LAB — COMPREHENSIVE METABOLIC PANEL
ALT: 18 U/L (ref 0–44)
AST: 19 U/L (ref 15–41)
Albumin: 3.1 g/dL — ABNORMAL LOW (ref 3.5–5.0)
Alkaline Phosphatase: 44 U/L (ref 38–126)
Anion gap: 4 — ABNORMAL LOW (ref 5–15)
BUN: <5 mg/dL — ABNORMAL LOW (ref 6–20)
CO2: 27 mmol/L (ref 22–32)
Calcium: 7.9 mg/dL — ABNORMAL LOW (ref 8.9–10.3)
Chloride: 106 mmol/L (ref 98–111)
Creatinine, Ser: 0.76 mg/dL (ref 0.44–1.00)
GFR, Estimated: >60 mL/min (ref >60)
Glucose, Bld: 99 mg/dL (ref 70–99)
Potassium: 3.4 mmol/L — ABNORMAL LOW (ref 3.5–5.1)
Sodium: 137 mmol/L (ref 135–145)
Total Bilirubin: 0.2 mg/dL — ABNORMAL LOW (ref 0.3–1.2)
Total Protein: 5.7 g/dL — ABNORMAL LOW (ref 6.5–8.1)

## 2021-06-08 LAB — TRIGLYCERIDES: Triglycerides: 51 mg/dL (ref <150)

## 2021-06-08 LAB — MAGNESIUM: Magnesium: 1.6 mg/dL — ABNORMAL LOW (ref 1.7–2.4)

## 2021-06-08 LAB — PHOSPHORUS: Phosphorus: 3.8 mg/dL (ref 2.5–4.6)

## 2021-06-08 MED ORDER — FENTANYL CITRATE (PF) 100 MCG/2ML IJ SOLN
INTRAMUSCULAR | Status: AC
  Administered 2021-06-08: 50 ug
  Filled 2021-06-08: qty 2

## 2021-06-08 MED ORDER — FENTANYL CITRATE PF 50 MCG/ML IJ SOSY: 50.0000 ug | PREFILLED_SYRINGE | Freq: Once | INTRAMUSCULAR | Status: AC

## 2021-06-08 MED ORDER — MAGNESIUM SULFATE 2 GM/50ML IV SOLN
2.0000 g | Freq: Once | INTRAVENOUS | Status: AC
  Administered 2021-06-08: 2 g via INTRAVENOUS
  Filled 2021-06-08: qty 50

## 2021-06-08 MED ORDER — TRAVASOL 10 % IV SOLN
INTRAVENOUS | Status: DC
  Filled 2021-06-08: qty 381.6

## 2021-06-08 MED ORDER — LIDOCAINE HCL 1 % IJ SOLN
INTRAMUSCULAR | Status: AC
  Administered 2021-06-08: 10 mL
  Filled 2021-06-08: qty 20

## 2021-06-08 NOTE — Progress Notes (Signed)
PHARMACY - TOTAL PARENTERAL NUTRITION CONSULT NOTE   Indication:  MCAD and malnutrition  Patient Measurements:     There is no height or weight on file to calculate BMI. Usual Weight:   Assessment: 36 years of age female with a past medical history of medium-chain acyl-CoA dehydrogenase (MCAD) deficiency, chronic Lyme disease, and suffers from malnutrition due to food allergies and angioedema issues who presented 06/05/21 with vascular access problems - one catheter leaking and the other blocked. Per discussion with patient on admission, she has not had her TPN for several months due to moving from [place] and needing to re-establish care in the area. Patient has a central venous catheter (placed at an outside facility) for supplemental TPN and daily IV hydration which has recently become unusable. Plan for image-guided tunneled central venous catheter exchange and resume TPN when cleared for use.    Last known home TPN formula IngenioRx which is now [employer] [phone]  - Called and no records available via insurance. Also called [facility] as notes in chart but no records of TPN as well.   Glucose / Insulin: CBGs 90-123 (on Fludrocortisone + Hydrocortisone chronically). Patient reports a history of insulin resistance.  Electrolytes: K low at 3.4 - 20 BID ordered (on Spironolactone), Mg low at 1.6 - 2g ordered; others including Phos are within normal limits.  Renal: SCr 0.59 >>0.76 (unknown baseline) Hepatic: LFTs, TG wnl. Tbili low at 0.2. Albumin 3.1.  Intake / Output; MIVF: LBM 1/22 GI Imaging: GI Surgeries / Procedures:   Food allergies not listed in chart. Relayed to dietician prior to going to IR that she has allergies to tomato, barley and mustard were the big allergies. No mention of fish. [place] for Eggs. Will monitor closely.   Central access: Tunneled PICC 06/08/21 TPN start date: 06/08/21   Nutritional Goals: Goal TPN rate is 65 mL/hr (provides 83 g of protein and 1680  kcals per day)  RD Assessment: Estimated Needs Total Energy Estimated Needs: 1600-1800 kcal/d Total Protein Estimated Needs: 80-90 g/d Total Fluid Estimated Needs: 1.8-1.9 L/d   Current Nutrition:  NPO  Plan:  Start TPN at 49mL/hr at 1800 Electrolytes in TPN: Na 12mEq/L, K 51mEq/L, Ca 65mEq/L, Mg 31mEq/L, and Phos 34mmol/L. Cl:Ac 1:1 Add standard MVI and trace elements to TPN Initiate Sensitive q4h SSI and adjust as needed  Reduce MIVF to 70 mL/hr at 1800 Monitor TPN labs on Mon/Thurs, repeat labs tomorrow Monitor closely for any signs or symptoms of reactions  Sloan Leiter, PharmD, BCPS, BCCCP Clinical Pharmacist Please refer to [facility] for [place] numbers 06/08/2021,7:10 AM

## 2021-06-08 NOTE — Procedures (Signed)
Interventional Radiology Procedure Note  Procedure: RT IJ TUNNELED PICC EXCHG    Complications: None  Estimated Blood Loss:  0  Findings: TIP SVCRA    Tamera Punt, MD

## 2021-06-08 NOTE — Progress Notes (Signed)
Mobility Specialist Progress Note   06/08/21 1500  Mobility  Activity Refused mobility   Received pt on EOB seemingly agitated and stating that she was nauseous. Also stated " I just want to go home." RN notified.    Holland Falling Mobility Specialist Phone Number [phone]
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
## 2021-06-08 NOTE — Discharge Summary (Signed)
PatientPhysician Discharge Summary  Melanie Graves SPQ:[id number] DOB: 30-Nov-1985 DOA: 06/05/2021  PCP: Pcp, No  Admit date: 06/05/2021 Discharge date: 06/08/2021 30 Day Unplanned Readmission Risk Score    Flowsheet Row ED to Hosp-Admission (Current) from 06/05/2021 in [facility]  SURGICAL  30 Day Unplanned Readmission Risk Score (%) 23.57 Filed at 06/08/2021 1200       This score is the patient's risk of an unplanned readmission within 30 days of being discharged (0 -100%). The score is based on dignosis, age, lab data, medications, orders, and past utilization.   Low:  0-14.9   Medium: 15-21.9   High: 22-29.9   Extreme: 30 and above          Admitted From: Home Disposition: Home  Recommendations for Outpatient Follow-up:  Follow up with PCP in 1-2 weeks Please obtain BMP/CBC in one week Please follow up with your PCP on the following pending results: Unresulted Labs (From admission, onward)     Start     Ordered   06/08/21 0500  Comprehensive metabolic panel  (TPN Lab Panel)  Every Mon,Thu (0500),   R      06/06/21 0031   06/08/21 0500  Magnesium  (TPN Lab Panel)  Every Mon,Thu (0500),   R      06/06/21 0031   06/08/21 0500  Phosphorus  (TPN Lab Panel)  Every Mon,Thu (0500),   R      06/06/21 0031   06/08/21 0500  Triglycerides  (TPN Lab Panel)  Every Monday (0500),   R      06/06/21 0031              Home Health: None Equipment/Devices: None  Discharge Condition: Stable CODE STATUS: Full code Diet recommendation: Cardiac  Subjective: Seen and examined.  No complaints.  Brief/Interim Summary: Melanie Graves is a 36 y.o. female with medical history significant of very complex patient with MCAD, mast cell activation syndrome, adrenal insufficiency, food allergy, angio-edema, chonic Lyme's disease,POTS, ADD and more. She has been followed by Dr. Marin Graves in [place] - allergy/immunology. Currently does not have PCP. She has seen various ID  specialists,across several states for management of chronic Lyme's and reports she took IV ceftriaxone for 1 year. She suffers malnutrition due to food allergies and angio-edema issues and says that she has been taking supplemental TPN last does several weeks to months ago. She requires daily IV hydration. For these issue she has central venous catheter. She lost access recently. She was scheduled for tunneled line replacement with [facility] 06/04/30 but missed that appointment.  For this reason, she was admitted and finally underwent placement of right IJ tunneled PICC exchange today by IR.  She had no complications.  She is being discharged in stable condition.  Discharge plan was discussed with patient and/or family member and they verbalized understanding and agreed with it.  Discharge Diagnoses:  Principal Problem:   Adrenal cortical hypofunction (HCC) Active Problems:   Mast cell activation syndrome (HCC)   POTS (postural orthostatic tachycardia syndrome)   Chronic hypokalemia   ADD (attention deficit disorder)   Fracture of 5th metatarsal   Occluded PICC line, initial encounter [facility])    Discharge Instructions   Allergies as of 06/08/2021       Reactions   Barley Grass Hives   Beta Adrenergic Blockers Other (See Comments)   Patient states it is contraindicated for her.    Chlorhexidine Hives   Fluconazole Other (See Comments)  Fixed drug eruptuions blisters "Autoimmune fixed drug eruptions" - can take it but has to take zyrtec and zantac prior   Mustard Seed Hives, Itching   Risperdal [risperidone]    Adverse reactions   Tomato Hives        Medication List     TAKE these medications    albuterol 108 (90 Base) MCG/ACT inhaler Commonly known as: VENTOLIN HFA Inhale 1-2 puffs into the lungs every 6 (six) hours as needed for wheezing or shortness of breath.   amoxicillin-clavulanate 875-125 MG tablet Commonly known as: AUGMENTIN Take 1 tablet by mouth See admin  instructions. Bid x 14 days   ascorbic acid 1000 MG tablet Commonly known as: VITAMIN C Take 1,000 mg by mouth daily as needed (immune support).   azelastine 0.1 % nasal spray Commonly known as: ASTELIN Place 2 sprays into both nostrils daily as needed for rhinitis or allergies.   Cathflo Activase 2 MG injection Generic drug: alteplase 2 mg by Intracatheter route daily as needed for open catheter.   cefUROXime 500 MG tablet Commonly known as: CEFTIN Take 500 mg by mouth See admin instructions. Bid x 10 days   cetirizine HCl 1 MG/ML solution Commonly known as: ZYRTEC Take 20 mg by mouth as needed (itching/ allergic reactions).   cetirizine 10 MG tablet Commonly known as: ZYRTEC Take 10 mg by mouth in the morning and at bedtime.   clemastine 2.68 MG Tabs tablet Commonly known as: TAVIST Take 2.68 mg by mouth in the morning and at bedtime.   clonazePAM 1 MG tablet Commonly known as: KLONOPIN Take 1 mg by mouth 4 (four) times daily.   cloNIDine 0.1 MG tablet Commonly known as: CATAPRES Take 0.1 mg by mouth daily as needed (high blood pressure).   cromolyn 100 MG/5ML solution Commonly known as: GASTROCROM Take 200 mg by mouth 4 (four) times daily.   diphenhydrAMINE 50 mg in sodium chloride 0.9 % 50 mL Inject 50 mg into the vein See admin instructions. 3-4 times daily   EPINEPHrine 1 MG/ML Soln Commonly known as: ADRENALIN Inject 0.3 mLs into the muscle as needed for anaphylaxis.   EQ Anti-Diarrheal 2 MG tablet Generic drug: loperamide Take 2 mg by mouth 4 (four) times daily as needed for diarrhea or loose stools.   FAMOTIDINE IV Inject 20 mg into the vein 4 (four) times daily.   famotidine 40 MG tablet Commonly known as: PEPCID Take 40 mg by mouth daily as needed (reflux/to prevent anaphylaxis).   fludrocortisone 0.1 MG tablet Commonly known as: FLORINEF Take 0.1 mg by mouth daily.   gabapentin 600 MG tablet Commonly known as: NEURONTIN Take 600 mg by  mouth 3 (three) times daily.   HYDROcodone-acetaminophen 5-325 MG tablet Commonly known as: NORCO/VICODIN Take 1 tablet by mouth every 4 (four) hours as needed. What changed: reasons to take this   hydrOXYzine 25 MG capsule Commonly known as: VISTARIL Take 50-100 mg by mouth in the morning and at bedtime.   ipratropium-albuterol 0.5-2.5 (3) MG/3ML Soln Commonly known as: DUONEB Take 3 mLs by nebulization every 4 (four) hours as needed (shortness of breath).   ketotifen 0.025 % ophthalmic solution Commonly known as: ZADITOR Place 1 drop into both eyes in the morning and at bedtime.   lactated ringers infusion Inject 1,000 mLs into the vein in the morning and at bedtime.   mupirocin ointment 2 % Commonly known as: BACTROBAN Apply 1 application topically daily as needed (open wounds).   naloxone 4 MG/0.1ML  Liqd nasal spray kit Commonly known as: NARCAN Place 4 mg into the nose daily as needed (opoid overdose).   Normal Saline Flush 0.9 % Soln Inject 10 mLs into the vein See admin instructions. 3-4 times daily   Nurtec 75 MG Tbdp Generic drug: Rimegepant Sulfate Take 75 mg by mouth daily as needed (migraine).   ondansetron 8 MG disintegrating tablet Commonly known as: ZOFRAN-ODT Take 8 mg by mouth every 8 (eight) hours as needed for nausea or vomiting.   potassium chloride 10 MEQ tablet Commonly known as: KLOR-CON Take 20 mEq by mouth 2 (two) times daily.   promethazine 25 mg in sodium chloride 0.9 % 1,000 mL Inject 25 mg into the vein See admin instructions. 3-4 times daily   promethazine 25 MG tablet Commonly known as: PHENERGAN Take 25 mg by mouth 2 (two) times daily as needed for vomiting or nausea.   Qnasl 80 MCG/ACT Aers Generic drug: Beclomethasone Dipropionate Place 2 sprays into both nostrils in the morning and at bedtime.   rizatriptan 10 MG tablet Commonly known as: MAXALT Take 10 mg by mouth as needed for migraine.   SOLU-CORTEF IJ Inject 100 mg as  directed See admin instructions. 2-4 times daily   spironolactone 25 MG tablet Commonly known as: ALDACTONE Take 25 mg by mouth daily.   Symbicort 160-4.5 MCG/ACT inhaler Generic drug: budesonide-formoterol Inhale 2 puffs into the lungs in the morning and at bedtime.   tezepelumab-ekko 210 MG/1.91ML syringe Commonly known as: TEZSPIRE Inject 210 mg into the skin every 30 (thirty) days.   tiZANidine 4 MG tablet Commonly known as: ZANAFLEX Take 4 mg by mouth daily.   Vitamin D3 125 MCG (5000 UT) Caps Take 10,000 Units by mouth daily.   Vyvanse 50 MG capsule Generic drug: lisdexamfetamine Take 50 mg by mouth daily.   Xolair 75 MG/0.5ML prefilled syringe Generic drug: omalizumab Inject 375 mg into the skin every 14 (fourteen) days.   zinc gluconate 50 MG tablet Take 50 mg by mouth daily as needed (immune support).        Follow-up Information     [facility] Schedule an appointment as soon as possible for a visit.   Contact information: [place] [address] [phone]               Allergies  Allergen Reactions   Barley Grass Hives   Beta Adrenergic Blockers Other (See Comments)    Patient states it is contraindicated for her.    Chlorhexidine Hives   Fluconazole Other (See Comments)    Fixed drug eruptuions blisters "Autoimmune fixed drug eruptions" - can take it but has to take zyrtec and zantac prior    Mustard Seed Hives and Itching   Risperdal [Risperidone]     Adverse reactions   Tomato Hives    Consultations: IR   Procedures/Studies: DG Chest 2 View  Result Date: 06/05/2021 CLINICAL DATA:  Fall, right shoulder pain EXAM: CHEST - 2 VIEW COMPARISON:  None. FINDINGS: Right internal jugular PICC line in place with the tip at the cavoatrial junction. Heart and mediastinal contours are within normal limits. No focal opacities or effusions. No acute bony abnormality. No visible rib fracture or  pneumothorax. IMPRESSION: No active cardiopulmonary disease. Electronically Signed   By: Rolm Baptise M.D.   On: 06/05/2021 22:58   DG Shoulder Right  Result Date: 06/05/2021 CLINICAL DATA:  Fall EXAM: RIGHT SHOULDER - 2+ VIEW COMPARISON:  None.  FINDINGS: There is no acute fracture or dislocation. Joint spaces are maintained. Soft tissue calcifications are seen inferior to the glenoid which may be related to old injury. Right-sided central venous catheter is partially visualized. IMPRESSION: 1. No acute fracture or dislocation. Electronically Signed   By: Ronney Asters M.D.   On: 06/05/2021 22:57   DG Ankle 2 Views Right  Result Date: 06/05/2021 CLINICAL DATA:  Fall, ankle pain EXAM: RIGHT ANKLE - 2 VIEW COMPARISON:  None. FINDINGS: Corticated bone density adjacent to the lateral malleolus felt to reflect old injury or secondary ossification center. Recommend correlation for pain in this area. No other evidence of acute fracture, subluxation or dislocation. IMPRESSION: Well corticated bone density adjacent to the lateral malleolus felt to reflect old injury or secondary ossification center. No visible acute fracture. Electronically Signed   By: Rolm Baptise M.D.   On: 06/05/2021 22:57   DG Foot 2 Views Right  Result Date: 06/05/2021 CLINICAL DATA:  Fall, right foot pain EXAM: RIGHT FOOT - 2 VIEW COMPARISON:  None. FINDINGS: There is no evidence of fracture or dislocation. There is no evidence of arthropathy or other focal bone abnormality. Soft tissues are unremarkable. IMPRESSION: Negative. Electronically Signed   By: Rolm Baptise M.D.   On: 06/05/2021 22:56   DG Foot Complete Left  Result Date: 05/15/2021 X-ray of the left foot was obtained in clinic today and demonstrates a nondisplaced fracture at the base of the fifth metatarsal.  Compared to prior x-rays, there is been no interval displacement.  No callus formation is visualized.  No other injuries are noted.   Impression: Nondisplaced  fracture of the base of the left fifth metatarsal.    Discharge Exam: Vitals:   06/08/21 1148 06/08/21 1200  BP: 134/85   Pulse: 96   Resp:    Temp:    SpO2: 99% 99%   Vitals:   06/08/21 0933 06/08/21 1019 06/08/21 1148 06/08/21 1200  BP:  127/77 134/85   Pulse:  (!) 108 96   Resp:  18    Temp:  98 F (36.7 C)    TempSrc:  Oral    SpO2: 100% 100% 99% 99%    General: Pt is alert, awake, not in acute distress Cardiovascular: RRR, S1/S2 +, no rubs, no gallops Respiratory: CTA bilaterally, no wheezing, no rhonchi Abdominal: Soft, NT, ND, bowel sounds + Extremities: no edema, no cyanosis    The results of significant diagnostics from this hospitalization (including imaging, microbiology, ancillary and laboratory) are listed below for reference.     Microbiology: No results found for this or any previous visit (from the past 240 hour(s)).   Labs: BNP (last 3 results) No results for input(s): BNP in the last 8760 hours. Basic Metabolic Panel: Recent Labs  Lab 06/05/21 2226 06/06/21 0437 06/08/21 0601  NA 136  --  137  K 3.7  --  3.4*  CL 103  --  106  CO2 24  --  27  GLUCOSE 100*  --  99  BUN <5*  --  <5*  CREATININE 0.59  --  0.76  CALCIUM 8.4*  --  7.9*  MG  --  2.4 1.6*  PHOS  --  3.0 3.8   Liver Function Tests: Recent Labs  Lab 06/05/21 2226 06/08/21 0601  AST 25 19  ALT 23 18  ALKPHOS 45 44  BILITOT 0.7 0.2*  PROT 6.0* 5.7*  ALBUMIN 3.3* 3.1*   No results for input(s): LIPASE, AMYLASE in  the last 168 hours. No results for input(s): AMMONIA in the last 168 hours. CBC: Recent Labs  Lab 06/05/21 2226 06/07/21 0450  WBC 12.8* 8.6  NEUTROABS 10.2*  --   HGB 11.1* 9.6*  HCT 34.9* 30.3*  MCV 85.1 86.1  PLT 294 289   Cardiac Enzymes: No results for input(s): CKTOTAL, CKMB, CKMBINDEX, TROPONINI in the last 168 hours. BNP: Invalid input(s): POCBNP CBG: Recent Labs  Lab 06/06/21 0159 06/06/21 2157  GLUCAP 123* 95   D-Dimer No results  for input(s): DDIMER in the last 72 hours. Hgb A1c No results for input(s): HGBA1C in the last 72 hours. Lipid Profile Recent Labs    06/06/21 0437 06/08/21 0601  TRIG 36 51   Thyroid function studies No results for input(s): TSH, T4TOTAL, T3FREE, THYROIDAB in the last 72 hours.  Invalid input(s): FREET3 Anemia work up No results for input(s): VITAMINB12, FOLATE, FERRITIN, TIBC, IRON, RETICCTPCT in the last 72 hours. Urinalysis    Component Value Date/Time   COLORURINE YELLOW 06/06/2021 1626   APPEARANCEUR HAZY (A) 06/06/2021 1626   LABSPEC 1.008 06/06/2021 1626   PHURINE 8.0 06/06/2021 1626   GLUCOSEU NEGATIVE 06/06/2021 1626   HGBUR NEGATIVE 06/06/2021 1626   BILIRUBINUR NEGATIVE 06/06/2021 1626   KETONESUR NEGATIVE 06/06/2021 1626   PROTEINUR NEGATIVE 06/06/2021 1626   NITRITE NEGATIVE 06/06/2021 1626   LEUKOCYTESUR SMALL (A) 06/06/2021 1626   Sepsis Labs Invalid input(s): PROCALCITONIN,  WBC,  LACTICIDVEN Microbiology No results found for this or any previous visit (from the past 240 hour(s)).   Time coordinating discharge: Over 30 minutes  SIGNED:   Darliss Cheney, MD  [facility] 06/08/2021, 1:04 PM  If 7PM-7AM, please contact night-coverage [email or web address]
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
## 2021-06-09 ENCOUNTER — Ambulatory Visit: Payer: Medicaid Other | Admitting: Orthopedic Surgery

## 2021-06-10 ENCOUNTER — Inpatient Hospital Stay (HOSPITAL_COMMUNITY): Admission: RE | Admit: 2021-06-10 | Payer: Medicaid Other | Source: Ambulatory Visit

## 2021-06-10 ENCOUNTER — Ambulatory Visit: Payer: Medicaid Other | Admitting: Orthopedic Surgery

## 2021-06-12 ENCOUNTER — Ambulatory Visit: Payer: Medicaid Other | Admitting: Orthopedic Surgery

## 2021-07-18 ENCOUNTER — Emergency Department (HOSPITAL_COMMUNITY): Payer: Medicaid Other

## 2021-07-18 ENCOUNTER — Other Ambulatory Visit: Payer: Self-pay

## 2021-07-18 ENCOUNTER — Encounter (HOSPITAL_COMMUNITY): Payer: Self-pay | Admitting: Emergency Medicine

## 2021-07-18 ENCOUNTER — Inpatient Hospital Stay (HOSPITAL_COMMUNITY)
Admission: EM | Admit: 2021-07-18 | Discharge: 2021-07-22 | DRG: 315 | Disposition: A | Discharge status: Home / Self Care | Payer: Medicaid Other | Attending: Family Medicine | Admitting: Family Medicine

## 2021-07-18 DIAGNOSIS — Z7952 Long term (current) use of systemic steroids: Secondary | ICD-10-CM

## 2021-07-18 DIAGNOSIS — E860 Dehydration: Secondary | ICD-10-CM | POA: Diagnosis present

## 2021-07-18 DIAGNOSIS — Z8249 Family history of ischemic heart disease and other diseases of the circulatory system: Secondary | ICD-10-CM

## 2021-07-18 DIAGNOSIS — W1830XA Fall on same level, unspecified, initial encounter: Secondary | ICD-10-CM | POA: Diagnosis present

## 2021-07-18 DIAGNOSIS — S2232XA Fracture of one rib, left side, initial encounter for closed fracture: Secondary | ICD-10-CM | POA: Diagnosis present

## 2021-07-18 DIAGNOSIS — D6489 Other specified anemias: Secondary | ICD-10-CM | POA: Diagnosis present

## 2021-07-18 DIAGNOSIS — R0781 Pleurodynia: Secondary | ICD-10-CM | POA: Diagnosis not present

## 2021-07-18 DIAGNOSIS — Z8616 Personal history of COVID-19: Secondary | ICD-10-CM

## 2021-07-18 DIAGNOSIS — Z808 Family history of malignant neoplasm of other organs or systems: Secondary | ICD-10-CM | POA: Diagnosis not present

## 2021-07-18 DIAGNOSIS — Z6822 Body mass index (BMI) 22.0-22.9, adult: Secondary | ICD-10-CM | POA: Diagnosis not present

## 2021-07-18 DIAGNOSIS — N390 Urinary tract infection, site not specified: Secondary | ICD-10-CM | POA: Diagnosis present

## 2021-07-18 DIAGNOSIS — Z7951 Long term (current) use of inhaled steroids: Secondary | ICD-10-CM | POA: Diagnosis not present

## 2021-07-18 DIAGNOSIS — E2740 Unspecified adrenocortical insufficiency: Secondary | ICD-10-CM | POA: Diagnosis present

## 2021-07-18 DIAGNOSIS — Z8051 Family history of malignant neoplasm of kidney: Secondary | ICD-10-CM

## 2021-07-18 DIAGNOSIS — Z608 Other problems related to social environment: Secondary | ICD-10-CM | POA: Diagnosis present

## 2021-07-18 DIAGNOSIS — J942 Hemothorax: Secondary | ICD-10-CM | POA: Diagnosis present

## 2021-07-18 DIAGNOSIS — Y712 Prosthetic and other implants, materials and accessory cardiovascular devices associated with adverse incidents: Secondary | ICD-10-CM | POA: Diagnosis present

## 2021-07-18 DIAGNOSIS — G90A Postural orthostatic tachycardia syndrome (POTS): Secondary | ICD-10-CM | POA: Diagnosis present

## 2021-07-18 DIAGNOSIS — F4310 Post-traumatic stress disorder, unspecified: Secondary | ICD-10-CM | POA: Diagnosis present

## 2021-07-18 DIAGNOSIS — Z66 Do not resuscitate: Secondary | ICD-10-CM | POA: Diagnosis present

## 2021-07-18 DIAGNOSIS — F419 Anxiety disorder, unspecified: Secondary | ICD-10-CM | POA: Diagnosis not present

## 2021-07-18 DIAGNOSIS — J45909 Unspecified asthma, uncomplicated: Secondary | ICD-10-CM | POA: Diagnosis present

## 2021-07-18 DIAGNOSIS — Z609 Problem related to social environment, unspecified: Secondary | ICD-10-CM | POA: Insufficient documentation

## 2021-07-18 DIAGNOSIS — E440 Moderate protein-calorie malnutrition: Secondary | ICD-10-CM | POA: Diagnosis present

## 2021-07-18 DIAGNOSIS — Z79899 Other long term (current) drug therapy: Secondary | ICD-10-CM

## 2021-07-18 DIAGNOSIS — D8940 Mast cell activation, unspecified: Secondary | ICD-10-CM | POA: Diagnosis present

## 2021-07-18 DIAGNOSIS — T82524A Displacement of infusion catheter, initial encounter: Secondary | ICD-10-CM | POA: Diagnosis present

## 2021-07-18 DIAGNOSIS — N3000 Acute cystitis without hematuria: Secondary | ICD-10-CM | POA: Diagnosis not present

## 2021-07-18 DIAGNOSIS — B9620 Unspecified Escherichia coli [E. coli] as the cause of diseases classified elsewhere: Secondary | ICD-10-CM | POA: Diagnosis present

## 2021-07-18 DIAGNOSIS — Z5986 Financial insecurity: Secondary | ICD-10-CM

## 2021-07-18 DIAGNOSIS — Z659 Problem related to unspecified psychosocial circumstances: Secondary | ICD-10-CM | POA: Insufficient documentation

## 2021-07-18 LAB — COMPREHENSIVE METABOLIC PANEL
ALT: 23 U/L (ref 0–44)
AST: 20 U/L (ref 15–41)
Albumin: 3 g/dL — ABNORMAL LOW (ref 3.5–5.0)
Alkaline Phosphatase: 70 U/L (ref 38–126)
Anion gap: 8 (ref 5–15)
BUN: 8 mg/dL (ref 6–20)
CO2: 31 mmol/L (ref 22–32)
Calcium: 9.2 mg/dL (ref 8.9–10.3)
Chloride: 101 mmol/L (ref 98–111)
Creatinine, Ser: 0.68 mg/dL (ref 0.44–1.00)
GFR, Estimated: >60 mL/min (ref >60)
Glucose, Bld: 85 mg/dL (ref 70–99)
Potassium: 3.9 mmol/L (ref 3.5–5.1)
Sodium: 140 mmol/L (ref 135–145)
Total Bilirubin: 0.4 mg/dL (ref 0.3–1.2)
Total Protein: 6.2 g/dL — ABNORMAL LOW (ref 6.5–8.1)

## 2021-07-18 LAB — CBC WITH DIFFERENTIAL/PLATELET
Abs Immature Granulocytes: 0.08 10*3/uL — ABNORMAL HIGH (ref 0.00–0.07)
Basophils Absolute: 0 10*3/uL (ref 0.0–0.1)
Basophils Relative: 0 %
Eosinophils Absolute: 0.1 10*3/uL (ref 0.0–0.5)
Eosinophils Relative: 0 %
HCT: 36.6 % (ref 36.0–46.0)
Hemoglobin: 11.4 g/dL — ABNORMAL LOW (ref 12.0–15.0)
Immature Granulocytes: 1 %
Lymphocytes Relative: 30 %
Lymphs Abs: 4.3 10*3/uL — ABNORMAL HIGH (ref 0.7–4.0)
MCH: 26.9 pg (ref 26.0–34.0)
MCHC: 31.1 g/dL (ref 30.0–36.0)
MCV: 86.3 fL (ref 80.0–100.0)
Monocytes Absolute: 1.1 10*3/uL — ABNORMAL HIGH (ref 0.1–1.0)
Monocytes Relative: 8 %
Neutro Abs: 8.5 10*3/uL — ABNORMAL HIGH (ref 1.7–7.7)
Neutrophils Relative %: 61 %
Platelets: 490 10*3/uL — ABNORMAL HIGH (ref 150–400)
RBC: 4.24 MIL/uL (ref 3.87–5.11)
RDW: 15.7 % — ABNORMAL HIGH (ref 11.5–15.5)
WBC: 14 10*3/uL — ABNORMAL HIGH (ref 4.0–10.5)
nRBC: 0 % (ref 0.0–0.2)

## 2021-07-18 LAB — URINALYSIS, ROUTINE W REFLEX MICROSCOPIC
Bilirubin Urine: NEGATIVE
Glucose, UA: NEGATIVE mg/dL
Hgb urine dipstick: NEGATIVE
Ketones, ur: NEGATIVE mg/dL
Leukocytes,Ua: NEGATIVE
Nitrite: NEGATIVE
Protein, ur: NEGATIVE mg/dL
Specific Gravity, Urine: 1.008 (ref 1.005–1.030)
pH: 7 (ref 5.0–8.0)

## 2021-07-18 LAB — I-STAT CHEM 8, ED
BUN: 9 mg/dL (ref 6–20)
Calcium, Ion: 1.23 mmol/L (ref 1.15–1.40)
Chloride: 99 mmol/L (ref 98–111)
Creatinine, Ser: 0.7 mg/dL (ref 0.44–1.00)
Glucose, Bld: 81 mg/dL (ref 70–99)
HCT: 35 % — ABNORMAL LOW (ref 36.0–46.0)
Hemoglobin: 11.9 g/dL — ABNORMAL LOW (ref 12.0–15.0)
Potassium: 3.8 mmol/L (ref 3.5–5.1)
Sodium: 139 mmol/L (ref 135–145)
TCO2: 33 mmol/L — ABNORMAL HIGH (ref 22–32)

## 2021-07-18 LAB — CORTISOL: Cortisol, Plasma: 1.7 ug/dL

## 2021-07-18 LAB — RESP PANEL BY RT-PCR (FLU A&B, COVID) ARPGX2
Influenza A by PCR: NEGATIVE
Influenza B by PCR: NEGATIVE
SARS Coronavirus 2 by RT PCR: POSITIVE — AB

## 2021-07-18 LAB — RAPID URINE DRUG SCREEN, HOSP PERFORMED
Amphetamines: NOT DETECTED
Barbiturates: NOT DETECTED
Benzodiazepines: NOT DETECTED
Cocaine: NOT DETECTED
Opiates: NOT DETECTED
Tetrahydrocannabinol: POSITIVE — AB

## 2021-07-18 LAB — I-STAT BETA HCG BLOOD, ED (MC, WL, AP ONLY): I-stat hCG, quantitative: <5 m[IU]/mL (ref <5)

## 2021-07-18 LAB — MAGNESIUM: Magnesium: 1.9 mg/dL (ref 1.7–2.4)

## 2021-07-18 MED ORDER — FAMOTIDINE IN NACL 20-0.9 MG/50ML-% IV SOLN
20.0000 mg | Freq: Once | INTRAVENOUS | Status: AC
  Administered 2021-07-18: 20 mg via INTRAVENOUS
  Filled 2021-07-18: qty 50

## 2021-07-18 MED ORDER — CROMOLYN SODIUM 100 MG/5ML PO CONC
200.0000 mg | Freq: Four times a day (QID) | ORAL | Status: DC
  Administered 2021-07-19 – 2021-07-21 (×5): 200 mg via ORAL
  Filled 2021-07-18 (×4): qty 10

## 2021-07-18 MED ORDER — FAMOTIDINE 20 MG PO TABS: 20.0000 mg | ORAL_TABLET | Freq: Every day | ORAL | Status: DC | PRN

## 2021-07-18 MED ORDER — SODIUM CHLORIDE 0.9 % IV SOLN
25.0000 mg | Freq: Once | INTRAVENOUS | Status: AC
  Administered 2021-07-18: 25 mg via INTRAVENOUS
  Filled 2021-07-18: qty 1

## 2021-07-18 MED ORDER — LIDOCAINE 5 % EX PTCH
1.0000 | MEDICATED_PATCH | CUTANEOUS | Status: DC
  Administered 2021-07-19 – 2021-07-21 (×3): 1 via TRANSDERMAL
  Filled 2021-07-18 (×3): qty 1

## 2021-07-18 MED ORDER — HYDROXYZINE HCL 50 MG PO TABS
100.0000 mg | ORAL_TABLET | Freq: Two times a day (BID) | ORAL | Status: DC
  Administered 2021-07-19 – 2021-07-22 (×6): 100 mg via ORAL
  Filled 2021-07-18 (×9): qty 2

## 2021-07-18 MED ORDER — PROMETHAZINE HCL 25 MG PO TABS: 25.0000 mg | ORAL_TABLET | Freq: Two times a day (BID) | ORAL | Status: DC | PRN

## 2021-07-18 MED ORDER — FLUDROCORTISONE ACETATE 0.1 MG PO TABS
0.1000 mg | ORAL_TABLET | Freq: Every day | ORAL | Status: DC
  Administered 2021-07-19 – 2021-07-22 (×3): 0.1 mg via ORAL
  Filled 2021-07-18 (×4): qty 1

## 2021-07-18 MED ORDER — HYDROCORTISONE SOD SUC (PF) 100 MG IJ SOLR
100.0000 mg | Freq: Once | INTRAMUSCULAR | Status: AC
  Administered 2021-07-18: 100 mg via INTRAVENOUS
  Filled 2021-07-18: qty 2

## 2021-07-18 MED ORDER — ALBUTEROL SULFATE HFA 108 (90 BASE) MCG/ACT IN AERS: 1.0000 | INHALATION_SPRAY | Freq: Four times a day (QID) | RESPIRATORY_TRACT | Status: DC | PRN

## 2021-07-18 MED ORDER — SODIUM CHLORIDE 0.9 % IV BOLUS
1000.0000 mL | Freq: Once | INTRAVENOUS | Status: AC
  Administered 2021-07-18: 1000 mL via INTRAVENOUS

## 2021-07-18 MED ORDER — DIPHENHYDRAMINE HCL 50 MG/ML IJ SOLN
50.0000 mg | Freq: Once | INTRAMUSCULAR | Status: AC
  Administered 2021-07-18: 50 mg via INTRAVENOUS
  Filled 2021-07-18: qty 1

## 2021-07-18 MED ORDER — FAMOTIDINE IN NACL 20-0.9 MG/50ML-% IV SOLN
20.0000 mg | Freq: Two times a day (BID) | INTRAVENOUS | Status: DC
  Administered 2021-07-19 – 2021-07-22 (×7): 20 mg via INTRAVENOUS
  Filled 2021-07-18 (×9): qty 50

## 2021-07-18 MED ORDER — IBUPROFEN 600 MG PO TABS: 600.0000 mg | ORAL_TABLET | Freq: Four times a day (QID) | ORAL | Status: DC | PRN

## 2021-07-18 MED ORDER — CLONAZEPAM 0.5 MG PO TABS
1.0000 mg | ORAL_TABLET | Freq: Four times a day (QID) | ORAL | Status: DC
  Administered 2021-07-19 – 2021-07-22 (×12): 1 mg via ORAL
  Filled 2021-07-18 (×13): qty 2

## 2021-07-18 MED ORDER — MOMETASONE FURO-FORMOTEROL FUM 200-5 MCG/ACT IN AERO
2.0000 | INHALATION_SPRAY | Freq: Two times a day (BID) | RESPIRATORY_TRACT | Status: DC
  Administered 2021-07-19 – 2021-07-22 (×5): 2 via RESPIRATORY_TRACT
  Filled 2021-07-18: qty 8.8

## 2021-07-18 MED ORDER — LORATADINE 10 MG PO TABS
10.0000 mg | ORAL_TABLET | Freq: Every day | ORAL | Status: DC
Start: 2021-07-19 — End: 2021-07-21
  Administered 2021-07-19 – 2021-07-20 (×2): 10 mg via ORAL
  Filled 2021-07-18 (×3): qty 1

## 2021-07-18 MED ORDER — IPRATROPIUM-ALBUTEROL 0.5-2.5 (3) MG/3ML IN SOLN: 3.0000 mL | RESPIRATORY_TRACT | Status: DC | PRN

## 2021-07-18 MED ORDER — TIZANIDINE HCL 2 MG PO TABS
4.0000 mg | ORAL_TABLET | Freq: Every day | ORAL | Status: DC
  Administered 2021-07-19 – 2021-07-22 (×3): 4 mg via ORAL
  Filled 2021-07-18 (×4): qty 2

## 2021-07-18 MED ORDER — AZELASTINE HCL 0.1 % NA SOLN: 2.0000 | Freq: Every day | NASAL | Status: DC | PRN

## 2021-07-18 NOTE — ED Triage Notes (Signed)
Pt reports central line was accidentally pulled out yesterday morning.  States she needs her IV fluids.  Also reports L rib pain that is worse with deep inspiration since recent fall.  States she was already evaluated post fall but she believes she has broken ribs. ?
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
## 2021-07-18 NOTE — ED Provider Notes (Signed)
[facility] EMERGENCY DEPARTMENT Provider Note   CSN: [id number] Arrival date & time: 07/18/21  1529     History  No chief complaint on file.   Melanie Graves is a 36 y.o. female history of mast cell activation syndrome, adrenal insufficiency, here presenting with central line pulled out.  Patient has PICC line in the right chest wall that was placed recently.  Patient states that she gets TPN from it and she also gets 3 times a day of Solu-Cortef and also Pepcid and Benadryl and Phenergan through it.  Patient states that she went to [facility] several days ago and was noted to have COVID.  She also fell and had some rib pain as well.  Patient states that yesterday, she woke up from sleep and realize she pulled it out in her sleep.  Patient states that she has not been getting her medicine so she has been throwing up and does not feel well.  Patient also does not have a primary care doctor right now.  She has been followed up in [place] with a immunologist (Dr. Marin Roberts) there.  The history is provided by the patient.      Home Medications Prior to Admission medications   Medication Sig Start Date End Date Taking? Authorizing Provider  albuterol (VENTOLIN HFA) 108 (90 Base) MCG/ACT inhaler Inhale 1-2 puffs into the lungs every 6 (six) hours as needed for wheezing or shortness of breath.    [provider]  amoxicillin-clavulanate (AUGMENTIN) 875-125 MG tablet Take 1 tablet by mouth See admin instructions. Bid x 14 days 05/18/21   [provider]  ascorbic acid (VITAMIN C) 1000 MG tablet Take 1,000 mg by mouth daily as needed (immune support).    [provider]  azelastine (ASTELIN) 0.1 % nasal spray Place 2 sprays into both nostrils daily as needed for rhinitis or allergies. 05/18/21   [provider]  CATHFLO ACTIVASE 2 MG injection 2 mg by Intracatheter route daily as needed for open catheter. 03/01/21   [provider]   cefUROXime (CEFTIN) 500 MG tablet Take 500 mg by mouth See admin instructions. Bid x 10 days 05/18/21   [provider]  cetirizine (ZYRTEC) 10 MG tablet Take 10 mg by mouth in the morning and at bedtime. 01/19/19   [provider]  cetirizine HCl (ZYRTEC) 1 MG/ML solution Take 20 mg by mouth as needed (itching/ allergic reactions).    [provider]  Cholecalciferol (VITAMIN D3) 125 MCG (5000 UT) CAPS Take 10,000 Units by mouth daily.    [provider]  clemastine (TAVIST) 2.68 MG TABS tablet Take 2.68 mg by mouth in the morning and at bedtime.    [provider]  clonazePAM (KLONOPIN) 1 MG tablet Take 1 mg by mouth 4 (four) times daily.    [provider]  cloNIDine (CATAPRES) 0.1 MG tablet Take 0.1 mg by mouth daily as needed (high blood pressure). 04/14/21   [provider]  cromolyn (GASTROCROM) 100 MG/5ML solution Take 200 mg by mouth 4 (four) times daily.    [provider]  diphenhydrAMINE 50 mg in sodium chloride 0.9 % 50 mL Inject 50 mg into the vein See admin instructions. 3-4 times daily    [provider]  EPINEPHrine (ADRENALIN) 1 MG/ML SOLN Inject 0.3 mLs into the muscle as needed for anaphylaxis.    [provider]  EQ ANTI-DIARRHEAL 2 MG tablet Take 2 mg by mouth 4 (four) times daily as  needed for diarrhea or loose stools. 04/12/21   [provider]  famotidine (PEPCID) 40 MG tablet Take 40 mg by mouth daily as needed (reflux/to prevent anaphylaxis).    [provider]  FAMOTIDINE IV Inject 20 mg into the vein 4 (four) times daily.    [provider]  fludrocortisone (FLORINEF) 0.1 MG tablet Take 0.1 mg by mouth daily.    [provider]  gabapentin (NEURONTIN) 600 MG tablet Take 600 mg by mouth 3 (three) times daily. 05/12/21   [provider]  HYDROcodone-acetaminophen (NORCO/VICODIN) 5-325 MG tablet Take 1 tablet by mouth every 4 (four) hours as  needed. Patient taking differently: Take 1 tablet by mouth every 4 (four) hours as needed for moderate pain. 04/26/21   Orpah Greek, MD  Hydrocortisone Sod Succinate (SOLU-CORTEF IJ) Inject 100 mg as directed See admin instructions. 2-4 times daily    [provider]  hydrOXYzine (VISTARIL) 25 MG capsule Take 50-100 mg by mouth in the morning and at bedtime. 04/12/21   [provider]  ipratropium-albuterol (DUONEB) 0.5-2.5 (3) MG/3ML SOLN Take 3 mLs by nebulization every 4 (four) hours as needed (shortness of breath).    [provider]  ketotifen (ZADITOR) 0.025 % ophthalmic solution Place 1 drop into both eyes in the morning and at bedtime.    [provider]  lactated ringers infusion Inject 1,000 mLs into the vein in the morning and at bedtime.    [provider]  mupirocin ointment (BACTROBAN) 2 % Apply 1 application topically daily as needed (open wounds). 05/01/21   [provider]  naloxone [facility]) nasal spray 4 mg/0.1 mL Place 4 mg into the nose daily as needed (opoid overdose). 02/03/21   [provider]  ondansetron (ZOFRAN-ODT) 8 MG disintegrating tablet Take 8 mg by mouth every 8 (eight) hours as needed for nausea or vomiting.    [provider]  potassium chloride (KLOR-CON) 10 MEQ tablet Take 20 mEq by mouth 2 (two) times daily. 04/12/21   [provider]  promethazine (PHENERGAN) 25 MG tablet Take 25 mg by mouth 2 (two) times daily as needed for vomiting or nausea.    [provider]  promethazine 25 mg in sodium chloride 0.9 % 1,000 mL Inject 25 mg into the vein See admin instructions. 3-4 times daily    [provider]  QNASL 80 MCG/ACT AERS Place 2 sprays into both nostrils in the morning and at bedtime. 05/18/21   [provider]  Rimegepant Sulfate (NURTEC) 75 MG TBDP Take 75 mg by mouth daily as needed (migraine).    [provider]  rizatriptan (MAXALT)  10 MG tablet Take 10 mg by mouth as needed for migraine. 06/23/16   [provider]  Sodium Chloride Flush (NORMAL SALINE FLUSH) 0.9 % SOLN Inject 10 mLs into the vein See admin instructions. 3-4 times daily    [provider]  spironolactone (ALDACTONE) 25 MG tablet Take 25 mg by mouth daily. 05/18/21   [provider]  SYMBICORT 160-4.5 MCG/ACT inhaler Inhale 2 puffs into the lungs in the morning and at bedtime. 05/18/21   [provider]  tezepelumab-ekko (TEZSPIRE) 210 MG/1.91ML syringe Inject 210 mg into the skin every 30 (thirty) days.    [provider]  tiZANidine (ZANAFLEX) 4 MG tablet Take 4 mg by mouth daily.    [provider]  VYVANSE 50 MG capsule Take 50 mg by mouth daily. 05/29/21   [provider]  XOLAIR 75 MG/0.5ML prefilled syringe Inject 375 mg into the skin every 14 (fourteen) days. 05/25/21   [provider]  zinc gluconate 50 MG tablet Take 50 mg by mouth daily as needed (immune support). 01/04/21   [provider]      Allergies    Barley grass, Beta adrenergic blockers, Chlorhexidine, Fluconazole, Mustard seed, Risperdal [risperidone], and Tomato    Review of Systems   Review of Systems  Gastrointestinal:  Positive for vomiting.  Neurological:  Positive for weakness.  All other systems reviewed and are negative.  Physical Exam Updated Vital Signs BP (!) 137/101    Pulse (!) 104    Temp 98.4 F (36.9 C)    Resp (!) 21    SpO2 100%  Physical Exam Vitals and nursing note reviewed.  Constitutional:      Comments: Dehydrated, vomiting  HENT:     Head: Normocephalic.     Nose: Nose normal.     Mouth/Throat:     Mouth: Mucous membranes are dry.  Eyes:     Extraocular Movements: Extraocular movements intact.     Pupils: Pupils are equal, round, and reactive to light.  Cardiovascular:     Rate and Rhythm: Normal rate and regular rhythm.  Pulmonary:     Effort: Pulmonary effort is normal.      Comments: Tenderness over the left lower ribs. Abdominal:     General: Abdomen is flat.     Palpations: Abdomen is soft.  Musculoskeletal:     Cervical back: Normal range of motion and neck supple.     Comments: Patient has a boot on left leg and also mild tenderness in the right hip area  Skin:    General: Skin is warm.  Neurological:     General: No focal deficit present.     Mental Status: She is oriented to person, place, and time.  Psychiatric:        Mood and Affect: Mood normal.        Behavior: Behavior normal.    ED Results / Procedures / Treatments   Labs (all labs ordered are listed, but only abnormal results are displayed) Labs Reviewed  CBC WITH DIFFERENTIAL/PLATELET - Abnormal; Notable for the following components:      Result Value   WBC 14.0 (*)    Hemoglobin 11.4 (*)    RDW 15.7 (*)    Platelets 490 (*)    Neutro Abs 8.5 (*)    Lymphs Abs 4.3 (*)    Monocytes Absolute 1.1 (*)    Abs Immature Granulocytes 0.08 (*)    All other components within normal limits  COMPREHENSIVE METABOLIC PANEL - Abnormal; Notable for the following components:   Total Protein 6.2 (*)    Albumin 3.0 (*)    All other components within normal limits  I-STAT CHEM 8, ED - Abnormal; Notable for the following components:   TCO2 33 (*)    Hemoglobin 11.9 (*)    HCT 35.0 (*)    All other components within normal limits  RESP PANEL BY RT-PCR (FLU A&B, COVID) ARPGX2  URINE CULTURE  MAGNESIUM  CORTISOL  URINALYSIS, ROUTINE W REFLEX MICROSCOPIC  RAPID URINE DRUG SCREEN, HOSP PERFORMED  I-STAT BETA HCG BLOOD, ED ([facility], [facility], [facility] ONLY)    EKG EKG Interpretation  Date/Time:  Saturday July 18 2021 17:28:43 EST Ventricular Rate:  106 PR Interval:  128 QRS Duration: 76 QT Interval:  340 QTC Calculation: 452 R Axis:   72  Text Interpretation: Sinus tachycardia Multiple ventricular premature complexes Consider left ventricular hypertrophy No significant change since last tracing Confirmed  by Wandra Arthurs [phone]) on 07/18/2021 5:54:59 PM  Radiology No results found.  Procedures Procedures    Angiocath insertion Performed by: Wandra Arthurs  Consent: Verbal consent obtained. Risks and benefits: risks, benefits and alternatives were discussed Time out: Immediately prior to procedure a "time out" was called to verify the correct patient, procedure, equipment, support staff and site/side marked as required.  Preparation: Patient was prepped and draped in the usual sterile fashion.  Vein Location: R antecube  Ultrasound Guided  Gauge: 20 long   Normal blood return and flush without difficulty Patient tolerance: Patient tolerated the procedure well with no immediate complications.    Medications Ordered in ED Medications  promethazine (PHENERGAN) 25 mg in sodium chloride 0.9 % 50 mL IVPB (has no administration in time range)  sodium chloride 0.9 % bolus 1,000 mL (1,000 mLs Intravenous New Bag/Given 07/18/21 1715)  famotidine (PEPCID) IVPB 20 mg premix (20 mg Intravenous New Bag/Given 07/18/21 1722)  hydrocortisone sodium succinate (SOLU-CORTEF) 100 MG injection 100 mg (100 mg Intravenous Given 07/18/21 1718)  diphenhydrAMINE (BENADRYL) injection 50 mg (50 mg Intravenous Given 07/18/21 1717)    ED Course/ Medical Decision Making/ A&P                           Medical Decision Making Elana Jian is a 36 y.o. female here presenting with central line pulled out.  Patient required a PICC line for IV Solu-Cortef for her adrenal insufficiency and also TPN.  Patient is chronically ill and appears very emaciated.  Patient also has mast cell activation syndrome and gets frequent anaphylaxis.  At this point, we will check labs and hydrate patient.  Patient may need admission for replacement of her central line.  7:41 PM Labs unremarkable.  X-ray did show single rib fracture on the left side.  At this point, will admit for PICC line placement.  Patient will likely need social work  and case management consult as well to address her social situation and her follow-up  Problems Addressed: Adrenal insufficiency ([place]): chronic illness or injury Dehydration: acute illness or injury  Amount and/or Complexity of Data Reviewed External Data Reviewed: notes. Labs: ordered. Decision-making details documented in ED Course. Radiology: ordered and independent interpretation performed. Decision-making details documented in ED Course. ECG/medicine tests: ordered.  Risk Prescription drug management.   Final Clinical Impression(s) / ED Diagnoses Final diagnoses:  None    Rx / DC Orders ED Discharge Orders     None         Drenda Freeze, MD 07/18/21 1942

## 2021-07-18 NOTE — Hospital Course (Addendum)
Melanie Graves is a 36 y.o. female who was admitted to [facility] on 3/4 after PICC line was dislodged at home. PMHx is significant for mast cell activation syndrome, POTS, ADD, anxiety, adrenal insufficiency, asthma. Hospital course is listed below by problem.  ? ?Mast Cell Activation Syndrome ?Dislodged PICC line  ?Patient admitted for re-insertion of central line which she had been using to self administer medications for reported [facility]. In ED, CBC- WBC 13.4/Hgb 10.1, UDS + THC, Cortisol 1.7, Mg 1.9, B- Hcg- (-), Ucx: E. Coli. She had no acute complaints on presentation other then feeling nauseous and diaphoretic from not having fluids. She was started back on her home regimen of IV Solu-cortef, fludrocortisone, IV pepcid, IV benadryl, IV phenergan. IR was consulted and placed an IJ tunneled catheter under general anesthesia. Tolerated well. RD  consulted, provided patient with nutrition needs. Discharged in stable condition and to resume normal medication regimen per her allergist (Dr. Marin Roberts located in [place], [place]). ? 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 Patient reports that she came to [facility] because she felt that no on in [place] would help her. 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 She also endorsed significant hx of trauma with continued related symptoms which is concerning for PTSD . She likely meets criteria for PTSD based on assessment although focused interview difficult. Likely does not meet criteria for ADHD; issues with memory and concentration appear to be much more recent and after start of current medication regimen which includes both  benadryl and BZD.  Recommendation was to wean Klonipin over a taper, discontinue Vyvanse and start Lexapro '5mg'$  daily. Also recommended outpatient psychiatry follow-up. ? ?Urinary tract infection ?UA wnl, but urine culture with >100,000 E.coli. Treated with Keflex, to be completed on 07/24/21. ? ?COVID-19 infection ?Diagnosed on 2/23 at [facility]. Received Paxlovid previously. Asymptomatic while admitted.  ? ?Social Barriers ?Significant social barriers including housing insecurity, financial insecurity, poor social support, PCP needs. TOC was consulted.  ? ?Other chronic conditions stable ?-Asthma, allergies ?-POTS ?-ADD, Anxiety ? ?PCP follow-up ?Ensure completion of Keflex for UTI ?Wean and taper Klonipin over months ?Initiate SSRI if patient desires recommendation for Lexapro '5mg'$  daily ?Ensure IJ tunneled catheter without sign of infection given hx central line infection ?Outpatient psychiatry follow-up recommended ? ? ?

## 2021-07-19 DIAGNOSIS — F419 Anxiety disorder, unspecified: Secondary | ICD-10-CM | POA: Diagnosis not present

## 2021-07-19 DIAGNOSIS — E2740 Unspecified adrenocortical insufficiency: Secondary | ICD-10-CM

## 2021-07-19 DIAGNOSIS — E860 Dehydration: Secondary | ICD-10-CM

## 2021-07-19 DIAGNOSIS — D8940 Mast cell activation, unspecified: Secondary | ICD-10-CM | POA: Diagnosis not present

## 2021-07-19 LAB — BASIC METABOLIC PANEL
Anion gap: 7 (ref 5–15)
BUN: 7 mg/dL (ref 6–20)
CO2: 31 mmol/L (ref 22–32)
Calcium: 8.6 mg/dL — ABNORMAL LOW (ref 8.9–10.3)
Chloride: 101 mmol/L (ref 98–111)
Creatinine, Ser: 0.66 mg/dL (ref 0.44–1.00)
GFR, Estimated: >60 mL/min (ref >60)
Glucose, Bld: 129 mg/dL — ABNORMAL HIGH (ref 70–99)
Potassium: 3.8 mmol/L (ref 3.5–5.1)
Sodium: 139 mmol/L (ref 135–145)

## 2021-07-19 MED ORDER — HYDROCORTISONE SOD SUC (PF) 100 MG IJ SOLR
100.0000 mg | Freq: Three times a day (TID) | INTRAMUSCULAR | Status: DC
  Administered 2021-07-19 – 2021-07-22 (×11): 100 mg via INTRAVENOUS
  Filled 2021-07-19 (×12): qty 2

## 2021-07-19 MED ORDER — DIPHENHYDRAMINE HCL 50 MG/ML IJ SOLN
50.0000 mg | Freq: Four times a day (QID) | INTRAMUSCULAR | Status: DC | PRN
  Administered 2021-07-19 – 2021-07-21 (×5): 50 mg via INTRAVENOUS
  Filled 2021-07-19 (×7): qty 1

## 2021-07-19 MED ORDER — DIPHENHYDRAMINE HCL 50 MG/ML IJ SOLN
50.0000 mg | Freq: Four times a day (QID) | INTRAMUSCULAR | Status: AC
  Administered 2021-07-19 (×3): 50 mg via INTRAVENOUS
  Filled 2021-07-19 (×3): qty 1

## 2021-07-19 MED ORDER — SODIUM CHLORIDE 0.9 % IV SOLN
25.0000 mg | Freq: Four times a day (QID) | INTRAVENOUS | Status: DC
  Administered 2021-07-19 – 2021-07-22 (×14): 25 mg via INTRAVENOUS
  Filled 2021-07-19: qty 1
  Filled 2021-07-19: qty 25
  Filled 2021-07-19 (×3): qty 1
  Filled 2021-07-19: qty 25
  Filled 2021-07-19 (×2): qty 1
  Filled 2021-07-19: qty 25
  Filled 2021-07-19 (×3): qty 1
  Filled 2021-07-19: qty 25
  Filled 2021-07-19 (×5): qty 1

## 2021-07-19 MED ORDER — ENOXAPARIN SODIUM 40 MG/0.4ML IJ SOSY
40.0000 mg | PREFILLED_SYRINGE | INTRAMUSCULAR | Status: DC
  Administered 2021-07-19 – 2021-07-20 (×2): 40 mg via SUBCUTANEOUS
  Filled 2021-07-19 (×2): qty 0.4

## 2021-07-19 MED ORDER — DIPHENHYDRAMINE HCL 50 MG/ML IJ SOLN
50.0000 mg | Freq: Once | INTRAMUSCULAR | Status: AC
  Administered 2021-07-19: 50 mg via INTRAVENOUS

## 2021-07-19 MED ORDER — DIPHENHYDRAMINE HCL 50 MG/ML IJ SOLN
INTRAMUSCULAR | Status: AC
  Filled 2021-07-19: qty 1

## 2021-07-19 MED ORDER — ACETAMINOPHEN 325 MG PO TABS
650.0000 mg | ORAL_TABLET | Freq: Four times a day (QID) | ORAL | Status: DC | PRN
  Administered 2021-07-19 – 2021-07-22 (×5): 650 mg via ORAL
  Filled 2021-07-19 (×5): qty 2

## 2021-07-19 MED ORDER — FAMOTIDINE IN NACL 20-0.9 MG/50ML-% IV SOLN
20.0000 mg | Freq: Once | INTRAVENOUS | Status: AC
  Administered 2021-07-19: 20 mg via INTRAVENOUS
  Filled 2021-07-19: qty 50

## 2021-07-19 NOTE — Progress Notes (Signed)
Ok to add Lovenox for DVT prophylaxis per Dr. Chauncey Reading. ? ?Onnie Boer, PharmD, BCIDP, AAHIVP, CPP ?Infectious Disease Pharmacist ?07/19/2021 2:13 PM ? ? ?

## 2021-07-19 NOTE — Consult Note (Signed)
Chief Complaint: Patient was seen in consultation today for Mast cell activation syndrome, malnutrition  Referring Physician(s):  Dr. Chauncey Reading  Supervising Physician:  Juliet Rude  Patient Status: [facility] - In-pt  History of Present Illness: Melanie Graves is a 36 y.o. female with a medical history significant for MCAD, mast cell activation syndrome, chronic Lyme disease and attention deficit disorder. 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 She usus a tunneled central venous catheter for TPN, hydration, and meds as needed.   She underwent line placement by Dr. Annamaria Boots 06/08/21 which has functioned well.  She reports that she noticed the line was retracted yesterday morning when she awoke.  She presented to the ED for line replacement and medication administration.   Past Medical History:  Diagnosis Date   ADD (attention deficit disorder)    Adrenal cortical hypofunction (HCC)    allergic rhinnitis    Arthritis    Asthma    Immune deficiency disorder ([place])    Lyme disease    MCAD (medium-chain acyl-CoA dehydrogenase deficiency) (HCC)    solar urticaria    Thyroid disease     Past Surgical History:  Procedure Laterality Date   ABDOMINAL SURGERY     ballon sinus plasty      Allergies: Beta adrenergic blockers, Chlorhexidine, Fluconazole, and Risperdal [risperidone]  Medications: Prior to Admission medications   Medication Sig Start Date End Date Taking? Authorizing Provider  albuterol (VENTOLIN HFA) 108 (90 Base) MCG/ACT inhaler Inhale 1-2 puffs into the lungs every 6 (six) hours as needed for wheezing or shortness of breath.   Yes [provider]  amoxicillin-clavulanate (AUGMENTIN) 875-125 MG tablet Take 1 tablet by mouth See admin instructions. Bid x 14 days 05/18/21  Yes [provider]  ascorbic acid (VITAMIN C) 1000 MG tablet Take 1,000 mg by mouth daily as needed (immune support).   Yes [provider]   azelastine (ASTELIN) 0.1 % nasal spray Place 2 sprays into both nostrils daily as needed for rhinitis or allergies. 05/18/21  Yes [provider]  CATHFLO ACTIVASE 2 MG injection 2 mg by Intracatheter route daily as needed for open catheter. 03/01/21  Yes [provider]  cefUROXime (CEFTIN) 500 MG tablet Take 500 mg by mouth See admin instructions. Bid x 10 days 05/18/21  Yes [provider]  cetirizine (ZYRTEC) 10 MG tablet Take 10 mg by mouth in the morning and at bedtime. 01/19/19  Yes [provider]  cetirizine HCl (ZYRTEC) 1 MG/ML solution Take 20 mg by mouth as needed (itching/ allergic reactions).   Yes [provider]  Cholecalciferol (VITAMIN D3) 125 MCG (5000 UT) CAPS Take 10,000 Units by mouth daily.   Yes [provider]  clemastine (TAVIST) 2.68 MG TABS tablet Take 2.68 mg by mouth in the morning and at bedtime.   Yes [provider]  clonazePAM (KLONOPIN) 1 MG tablet Take 1 mg by mouth 4 (four) times daily.   Yes [provider]  cloNIDine (CATAPRES) 0.1 MG tablet Take 0.1 mg by mouth daily as needed (high blood pressure). 04/14/21  Yes [provider]  cromolyn (GASTROCROM) 100 MG/5ML solution Take 200 mg by mouth 4 (four) times daily.   Yes [provider]  diphenhydrAMINE 50 mg in sodium chloride 0.9 % 50 mL Inject 50 mg into the vein See admin instructions. 3-4 times daily   Yes [provider]  EPINEPHrine (ADRENALIN) 1 MG/ML SOLN Inject 0.3 mLs into the muscle as  needed for anaphylaxis.   Yes [provider]  EQ ANTI-DIARRHEAL 2 MG tablet Take 2 mg by mouth 4 (four) times daily as needed for diarrhea or loose stools. 04/12/21  Yes [provider]  famotidine (PEPCID) 40 MG tablet Take 40 mg by mouth daily as needed (reflux/to prevent anaphylaxis).   Yes [provider]  FAMOTIDINE IV Inject 20 mg into the vein 4 (four) times daily.   Yes [provider]  fludrocortisone (FLORINEF) 0.1 MG tablet Take 0.1 mg by mouth daily.   Yes [provider]  gabapentin (NEURONTIN) 600 MG tablet Take 600 mg by mouth 3 (three) times daily. 05/12/21  Yes [provider]  HYDROcodone-acetaminophen (NORCO/VICODIN) 5-325 MG tablet Take 1 tablet by mouth every 4 (four) hours as needed. Patient taking differently: Take 1 tablet by mouth every 4 (four) hours as needed for moderate pain. 04/26/21  Yes Pollina, Gwenyth Allegra, MD  Hydrocortisone Sod Succinate (SOLU-CORTEF IJ) Inject 100 mg as directed See admin instructions. 2-4 times daily   Yes [provider]  hydrOXYzine (VISTARIL) 25 MG capsule Take 50-100 mg by mouth in the morning and at bedtime. 04/12/21  Yes [provider]  ipratropium-albuterol (DUONEB) 0.5-2.5 (3) MG/3ML SOLN Take 3 mLs by nebulization every 4 (four) hours as needed (shortness of breath).   Yes [provider]  ketotifen (ZADITOR) 0.025 % ophthalmic solution Place 1 drop into both eyes in the morning and at bedtime.   Yes [provider]  lactated ringers infusion Inject 1,000 mLs into the vein in the morning and at bedtime.   Yes [provider]  mupirocin ointment (BACTROBAN) 2 % Apply 1 application topically daily as needed (open wounds). 05/01/21  Yes [provider]  naloxone (NARCAN) nasal spray 4 mg/0.1 mL Place 4 mg into the nose daily as needed (opoid overdose). 02/03/21  Yes [provider]  ondansetron (ZOFRAN-ODT) 8 MG disintegrating tablet Take 8 mg by mouth every 8 (eight) hours as needed for nausea or vomiting.   Yes [provider]  potassium chloride (KLOR-CON) 10 MEQ tablet Take 20 mEq by mouth 2 (two) times daily. 04/12/21  Yes [provider]  promethazine (PHENERGAN) 25 MG tablet Take 25 mg by mouth 2 (two) times daily as needed for vomiting or nausea.   Yes [provider]  promethazine 25 mg in sodium chloride 0.9 %  1,000 mL Inject 25 mg into the vein See admin instructions. 3-4 times daily   Yes [provider]  QNASL 80 MCG/ACT AERS Place 2 sprays into both nostrils in the morning and at bedtime. 05/18/21  Yes [provider]  Rimegepant Sulfate (NURTEC) 75 MG TBDP Take 75 mg by mouth daily as needed (migraine).   Yes [provider]  rizatriptan (MAXALT) 10 MG tablet Take 10 mg by mouth as needed for migraine. 06/23/16  Yes [provider]  Sodium Chloride Flush (NORMAL SALINE FLUSH) 0.9 % SOLN Inject 10 mLs into the vein See admin instructions. 3-4 times daily   Yes [provider]  spironolactone (ALDACTONE) 25 MG tablet Take 25 mg by mouth daily. 05/18/21  Yes [provider]  SYMBICORT 160-4.5 MCG/ACT inhaler Inhale 2 puffs into the lungs in the morning and at bedtime. 05/18/21  Yes [provider]  tezepelumab-ekko (TEZSPIRE) 210 MG/1.91ML syringe Inject 210 mg into the skin every 30 (thirty) days.   Yes [provider]  tiZANidine (ZANAFLEX) 4 MG tablet Take 4 mg by  mouth daily.   Yes [provider]  VYVANSE 50 MG capsule Take 50 mg by mouth daily. 05/29/21  Yes [provider]  Arvid Right 75 MG/0.5ML prefilled syringe Inject 375 mg into the skin every 14 (fourteen) days. 05/25/21  Yes [provider]  zinc gluconate 50 MG tablet Take 50 mg by mouth daily as needed (immune support). 01/04/21  Yes [provider]     Family History  Problem Relation Age of Onset   Renal cancer Mother    Osteoporosis Mother    Lumbar disc disease Mother    Skin cancer Father    Drug abuse Sister    Hepatitis C Brother    Heart disease Brother    Asthma Brother     Social History   Socioeconomic History   Marital status: Single    Spouse name: Not on file   Number of children: Not on file   Years of education: Not on file   Highest education level: Not on file  Occupational History   Not on file  Tobacco Use    Smoking status: Never   Smokeless tobacco: Never  Vaping Use   Vaping Use: Some days   Substances: Nicotine  Substance and Sexual Activity   Alcohol use: Never   Drug use: Yes    Types: Marijuana   Sexual activity: Yes  Other Topics Concern   Not on file  Social History Narrative   Not on file   Social Determinants of Health   Financial Resource Strain: Not on file  Food Insecurity: Not on file  Transportation Needs: Not on file  Physical Activity: Not on file  Stress: Not on file  Social Connections: Not on file    Review of Systems: A 12 point ROS discussed and pertinent positives are indicated in the HPI above.  All other systems are negative.  Review of Systems  Constitutional:  Negative for appetite change and fatigue.  Respiratory:  Negative for cough and shortness of breath.   Cardiovascular:  Negative for chest pain and leg swelling.  Gastrointestinal:  Negative for diarrhea, nausea and vomiting.  Skin:  Positive for rash.          Neurological:  Negative for headaches.   Vital Signs: BP 124/78 (BP Location: Left Arm)    Pulse (!) 103    Temp 98.2 F (36.8 C) (Oral)    Resp 18    SpO2 99%   Physical Exam Constitutional:      General: She is not in acute distress.    Appearance: She is not ill-appearing.  HENT:     Mouth/Throat:     Mouth: Mucous membranes are moist.     Pharynx: Oropharynx is clear.  Neck:     Comments: Tunneled line removed. Dressing in place is clean.  Removed dressing and site is intact without erythema or warmth.  Pulmonary:     Effort: Pulmonary effort is normal.  Musculoskeletal:     Cervical back: Normal range of motion.  Neurological:     General: No focal deficit present.     Mental Status: She is alert and oriented to person, place, and time.  Psychiatric:        Mood and Affect: Mood normal.        Behavior: Behavior normal.        Thought Content: Thought content normal.        Judgment: Judgment normal.     Imaging: DG Chest 2 View  Result Date: 06/05/2021 CLINICAL DATA:  Fall, right shoulder pain EXAM: CHEST - 2 VIEW COMPARISON:  None. FINDINGS: Right internal jugular PICC line in place with the tip at the cavoatrial junction. Heart and mediastinal contours are within normal limits. No focal opacities or effusions. No acute bony abnormality. No visible rib fracture or pneumothorax. IMPRESSION: No active cardiopulmonary disease. Electronically Signed   By: Rolm Baptise M.D.   On: 06/05/2021 22:58   DG Shoulder Right  Result Date: 06/05/2021 CLINICAL DATA:  Fall EXAM: RIGHT SHOULDER - 2+ VIEW COMPARISON:  None. FINDINGS: There is no acute fracture or dislocation. Joint spaces are maintained. Soft tissue calcifications are seen inferior to the glenoid which may be related to old injury. Right-sided central venous catheter is partially visualized. IMPRESSION: 1. No acute fracture or dislocation. Electronically Signed   By: Ronney Asters M.D.   On: 06/05/2021 22:57   DG Ankle 2 Views Right  Result Date: 06/05/2021 CLINICAL DATA:  Fall, ankle pain EXAM: RIGHT ANKLE - 2 VIEW COMPARISON:  None. FINDINGS: Corticated bone density adjacent to the lateral malleolus felt to reflect old injury or secondary ossification center. Recommend correlation for pain in this area. No other evidence of acute fracture, subluxation or dislocation. IMPRESSION: Well corticated bone density adjacent to the lateral malleolus felt to reflect old injury or secondary ossification center. No visible acute fracture. Electronically Signed   By: Rolm Baptise M.D.   On: 06/05/2021 22:57   DG Foot 2 Views Right  Result Date: 06/05/2021 CLINICAL DATA:  Fall, right foot pain EXAM: RIGHT FOOT - 2 VIEW COMPARISON:  None. FINDINGS: There is no evidence of fracture or dislocation. There is no evidence of arthropathy or other focal bone abnormality. Soft tissues are unremarkable. IMPRESSION: Negative. Electronically Signed   By: Rolm Baptise  M.D.   On: 06/05/2021 22:56   DG Foot Complete Left  Result Date: 05/15/2021 X-ray of the left foot was obtained in clinic today and demonstrates a nondisplaced fracture at the base of the fifth metatarsal.  Compared to prior x-rays, there is been no interval displacement.  No callus formation is visualized.  No other injuries are noted.   Impression: Nondisplaced fracture of the base of the left fifth metatarsal.   Labs:  CBC: Recent Labs    06/05/21 2226 06/07/21 0450  WBC 12.8* 8.6  HGB 11.1* 9.6*  HCT 34.9* 30.3*  PLT 294 289    COAGS: No results for input(s): INR, APTT in the last 8760 hours.  BMP: Recent Labs    06/05/21 2226  NA 136  K 3.7  CL 103  CO2 24  GLUCOSE 100*  BUN <5*  CALCIUM 8.4*  CREATININE 0.59  GFRNONAA >60    LIVER FUNCTION TESTS: Recent Labs    06/05/21 2226  BILITOT 0.7  AST 25  ALT 23  ALKPHOS 45  PROT 6.0*  ALBUMIN 3.3*    TUMOR MARKERS: No results for input(s): AFPTM, CEA, CA199, CHROMGRNA in the last 8760 hours.  Assessment and Plan:  Mast cell activation disorder; requires TPN/hydration; malfunctioning central venous catheter:  Patient admitted after line was inadvertently removed at home.  Line is no longer in place upon assessment today.  IR consulted for replacement.  Patient asks about Port-A-Cath placement, however she currently self administers meds and TPN at home.  Her TPN infuses 24/7 and she reports she cannot tolerate a fast rate due to anaphylaxis potential.  She requires a double lumen for concomittant administration of  meds as needed.  She reports allergy to cholorhexadine and requests iodine or alcohol for skin prep.   Sedation/fentanyl was given at previous line placement.  Will make NPO p MN.   Risks and benefits discussed with the patient including, but not limited to bleeding, infection and vascular injury.  All of the patient's questions were answered, patient is agreeable to proceed. She will be NPO  at midnight.   Consent signed and in IR.   Thank you for this interesting consult.  I greatly enjoyed meeting Ritamarie Arkin and look forward to participating in their care.  A copy of this report was sent to the requesting provider on this date.  Brynda Greathouse, MS RD PA-C    I spent a total of 20 Minutes    in face to face in clinical consultation, greater than 50% of which was counseling/coordinating care for malnutrition, tunneled central catheter placement/exchange.
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
## 2021-07-19 NOTE — Progress Notes (Signed)
Patient was admitted to the unit with her belongings. Patient brought Xolair and Gastrocrom up with her from. Med were taken to Pharmacy for processing. Pharm techs stated they needed to be processed then will return with patient later. Patient has been oriented to the room and has call bell.  ?

## 2021-07-19 NOTE — Significant Event (Signed)
Rapid Response Event Note  ? ?Reason for Call :  ?MCAS ? ?Initial Focused Assessment:  ?Patient has complaints of tingling in her mouth, facial swelling, and urticaria. She has a history of Mast cell activation syndrome and she states that she feels like she does before she has angioedema. She was given steroids, famotidine, phenergan, and benadryl earlier today. Patient is tachycardic and states that she has a prolonged QTC. ? ?She is warm to the touch. No urticaria seen on assessment. No wheezing or stridor on exam. No tongue or lip swelling at the present moment.  ? ? ?Interventions:  ?Benadryl 50 mg IV ?Famotidine ordered (awaiting from pharmacy) ?Placed on telemetry ?12-lead EKG shows sinus tachycardia with normal QTC ? ?Plan of Care:  ?Patient will remain on 23M.  ? ?Event Summary:  ? ?MD Notified: Dr. Chauncey Reading bedside ?Call Time: 1559 ?Arrival Time: 1603 ?End Time: 4034 ? ?Venetia Maxon, RN ?

## 2021-07-19 NOTE — Progress Notes (Signed)
Dr. Marin Roberts who follows patient outpatient has called. States to call his cell phone with any questions regarding her care outpatient. His number is [phone].  ?

## 2021-07-20 DIAGNOSIS — E860 Dehydration: Secondary | ICD-10-CM | POA: Diagnosis not present

## 2021-07-20 DIAGNOSIS — E2740 Unspecified adrenocortical insufficiency: Secondary | ICD-10-CM | POA: Diagnosis not present

## 2021-07-20 DIAGNOSIS — D8940 Mast cell activation, unspecified: Secondary | ICD-10-CM | POA: Diagnosis not present

## 2021-07-20 DIAGNOSIS — F419 Anxiety disorder, unspecified: Secondary | ICD-10-CM | POA: Diagnosis not present

## 2021-07-20 MED ORDER — CEPHALEXIN 500 MG PO CAPS
500.0000 mg | ORAL_CAPSULE | Freq: Two times a day (BID) | ORAL | Status: DC
  Administered 2021-07-20 – 2021-07-22 (×4): 500 mg via ORAL
  Filled 2021-07-20 (×5): qty 1

## 2021-07-20 MED ORDER — KATE FARMS STANDARD 1.4 PO LIQD
325.0000 mL | Freq: Two times a day (BID) | ORAL | Status: DC
  Administered 2021-07-22 (×2): 325 mL via ORAL
  Filled 2021-07-20 (×4): qty 325

## 2021-07-20 MED ORDER — GABAPENTIN 600 MG PO TABS
600.0000 mg | ORAL_TABLET | Freq: Three times a day (TID) | ORAL | Status: DC
  Administered 2021-07-20 – 2021-07-22 (×5): 600 mg via ORAL
  Filled 2021-07-20 (×6): qty 1

## 2021-07-20 NOTE — Progress Notes (Signed)
Initial Nutrition Assessment ? ?DOCUMENTATION CODES:  ?Not applicable ? ?INTERVENTION:  ?-continue regular diet ?-transition to appropriate for room service with assistance to ensure pt has help discovering all available meal options given pt with copious dietary restrictions  ?-RD to follow-up 07/21/21 for dietary preferences/restrictions to continue work with Food and IT consultant ?-RD to also provide TF recommendations once safe foods are determined  ?-trial [employer] 1.4 PO BID, each supplement provides 455 kcal and 20 grams protein. ? 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 ? ?GOAL:  ?Patient will meet greater than or equal to 90% of their needs ? ?MONITOR:  ?PO intake, Supplement acceptance, Labs, Weight trends, I & O's ? ?REASON FOR ASSESSMENT:  ?Consult ?Assessment of nutrition requirement/status ? ?ASSESSMENT:  ?PICC line placement for TPN with complex medical history including MCAS with frequent angioedema, adrenal insufficiency, POTS, ADD. ? ?Pt unavailable at time of RD visit x2 attempts. Discussed pt with DO and with RN in detail. Concerned that pt will not have many options that are suitable for her diet at this facility. RD to obtain list of safe foods from pt to provide to Food and Nutrition Services Director so that pt can have more food options made available to her throughout her stay. For the time being, plan to ensure pt is on regular diet with diet office calling pt to aid in meal selection. RD to follow-up tomorrow, Tuesday, 3/7, to obtain pt's safe foods list and to continue next steps with FNS team. Per DO, plan is to hold off on TPN and proceed with PEG placement. RD working to compile list of TF that previous pts with this condition have tolerated.  ? ?PO Intake: 25% x 1 recorded meal   ? ?Medications: ?Scheduled Meds: ? cephALEXin  500 mg Oral BID   ? clonazePAM  1 mg Oral QID  ? cromolyn  200 mg Oral QID  ? enoxaparin (LOVENOX) injection  40 mg Subcutaneous Q24H  ? fludrocortisone  0.1 mg Oral Daily  ? gabapentin  600 mg Oral TID  ? hydrocortisone sod succinate (SOLU-CORTEF) inj  100 mg Intravenous Q8H  ? hydrOXYzine  100 mg Oral BID  ? lidocaine  1 patch Transdermal Q24H  ? loratadine  10 mg Oral Daily  ? mometasone-formoterol  2 puff Inhalation BID  ? tiZANidine  4 mg Oral Daily  ?Continuous Infusions: ? famotidine (PEPCID) IV 20 mg (07/20/21 1029)  ? promethazine (PHENERGAN) injection (IM or IVPB) 25 mg (07/20/21 1528)  ? ?Labs: ?Recent Labs  ?Lab 07/18/21 ?1657 07/18/21 ?1724 07/19/21 ?0601  ?NA 140 139 139  ?K 3.9 3.8 3.8  ?CL 101 99 101  ?CO2 31  --  31  ?BUN '8 9 7  '$ ?CREATININE 0.68 0.70 0.66  ?CALCIUM 9.2  --  8.6*  ?MG 1.9  --   --   ?GLUCOSE 85 81 129*  ? ?NUTRITION - FOCUSED PHYSICAL EXAM: ?Unable to perform at this time. Will attempt to obtain tomorrow at follow-up.  ? ?Diet Order:   ?Diet Order   ? ?       ?  Diet NPO time specified Except for: Sips with Meds  Diet effective midnight       ?  ?  Diet regular Room service appropriate? Yes; Fluid consistency: Thin  Diet effective now       ?  ? ?  ?  ? ?  ? ?  EDUCATION NEEDS:  ?No education needs have been identified at this time ? ?Skin:  Skin Assessment: Reviewed RN Assessment ? ?Last BM:  PTA ? ?Height:  ?Ht Readings from Last 1 Encounters:  ?07/18/21 '5\' 7"'$  (1.702 m)  ? ?Weight:  ?Wt Readings from Last 1 Encounters:  ?07/18/21 66.4 kg  ? ?BMI:  Body mass index is 22.93 kg/m?. ? ?Estimated Nutritional Needs:  ?Kcal:  1800-2000 ?Protein:  90-100 grams ?Fluid:  >1.8L ? ? ?Theone Stanley., MS, RD, LDN (she/her/hers) ?RD pager number and weekend/on-call pager number located in [place]. ? ?

## 2021-07-20 NOTE — Progress Notes (Signed)
Patient sleeping and resting comfortably.  Rounded with primary RN.  Concerned that  Code status listed as Prior on chart.  Appreciated nightly round. ? ?Today's Vitals  ? 07/19/21 0903 07/19/21 1437 07/19/21 1955 07/19/21 2202  ?BP: (!) 119/91   138/85  ?Pulse: 98   (!) 108  ?Resp: 18   20  ?Temp: 98.2 ?F (36.8 ?C)   98.3 ?F (36.8 ?C)  ?TempSrc: Oral     ?SpO2: 100%  99% 98%  ?Weight:      ?Height:      ?PainSc: 0-No pain 3   0-No pain  ? ?Body mass index is 22.93 kg/m?.  ? ?Review of chart shows documentation of Code Status as Full Code in H&P from 03/04.  Prior documentation from 05/2021 same. ?ACP from [place] [facility] 02/14/2021 noted to be DNR. ?Placed order for Full code and will have day team follow up to confirm status with patient. ? ? ?Carollee Leitz, MD ?Family Medicine Residency    ?
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
## 2021-07-21 ENCOUNTER — Inpatient Hospital Stay (HOSPITAL_COMMUNITY): Payer: Medicaid Other

## 2021-07-21 DIAGNOSIS — N3000 Acute cystitis without hematuria: Secondary | ICD-10-CM | POA: Diagnosis not present

## 2021-07-21 DIAGNOSIS — D8940 Mast cell activation, unspecified: Secondary | ICD-10-CM | POA: Diagnosis not present

## 2021-07-21 DIAGNOSIS — E2740 Unspecified adrenocortical insufficiency: Secondary | ICD-10-CM | POA: Diagnosis not present

## 2021-07-21 DIAGNOSIS — F419 Anxiety disorder, unspecified: Secondary | ICD-10-CM | POA: Diagnosis not present

## 2021-07-21 HISTORY — PX: IR FLUORO GUIDE CV LINE RIGHT: IMG2283

## 2021-07-21 HISTORY — PX: IR US GUIDE VASC ACCESS RIGHT: IMG2390

## 2021-07-21 LAB — URINE CULTURE: Culture: >=100000 — AB

## 2021-07-21 LAB — CBC
HCT: 31.9 % — ABNORMAL LOW (ref 36.0–46.0)
Hemoglobin: 10.1 g/dL — ABNORMAL LOW (ref 12.0–15.0)
MCH: 26.5 pg (ref 26.0–34.0)
MCHC: 31.7 g/dL (ref 30.0–36.0)
MCV: 83.7 fL (ref 80.0–100.0)
Platelets: 382 10*3/uL (ref 150–400)
RBC: 3.81 MIL/uL — ABNORMAL LOW (ref 3.87–5.11)
RDW: 16.3 % — ABNORMAL HIGH (ref 11.5–15.5)
WBC: 13.4 10*3/uL — ABNORMAL HIGH (ref 4.0–10.5)
nRBC: 0 % (ref 0.0–0.2)

## 2021-07-21 LAB — MAGNESIUM: Magnesium: 2.2 mg/dL (ref 1.7–2.4)

## 2021-07-21 MED ORDER — LIDOCAINE HCL (PF) 1 % IJ SOLN
INTRAMUSCULAR | Status: AC | PRN
  Administered 2021-07-21: 10 mL

## 2021-07-21 MED ORDER — DIPHENHYDRAMINE HCL 50 MG/ML IJ SOLN
50.0000 mg | Freq: Four times a day (QID) | INTRAMUSCULAR | Status: DC
  Administered 2021-07-21 – 2021-07-22 (×5): 50 mg via INTRAVENOUS
  Filled 2021-07-21 (×4): qty 1

## 2021-07-21 MED ORDER — MIDAZOLAM HCL 2 MG/2ML IJ SOLN
INTRAMUSCULAR | Status: AC | PRN
  Administered 2021-07-21: 1 mg via INTRAVENOUS

## 2021-07-21 MED ORDER — LIDOCAINE HCL 1 % IJ SOLN
INTRAMUSCULAR | Status: AC
Start: 2021-07-21 — End: 2021-07-22
  Filled 2021-07-21: qty 20

## 2021-07-21 MED ORDER — MIDAZOLAM HCL 2 MG/2ML IJ SOLN
INTRAMUSCULAR | Status: AC
  Filled 2021-07-21: qty 2

## 2021-07-21 MED ORDER — FENTANYL CITRATE (PF) 100 MCG/2ML IJ SOLN
INTRAMUSCULAR | Status: AC | PRN
  Administered 2021-07-21: 50 ug via INTRAVENOUS

## 2021-07-21 MED ORDER — FENTANYL CITRATE (PF) 100 MCG/2ML IJ SOLN
INTRAMUSCULAR | Status: AC
  Filled 2021-07-21: qty 2

## 2021-07-21 MED ORDER — CETIRIZINE HCL 10 MG PO TABS
10.0000 mg | ORAL_TABLET | Freq: Two times a day (BID) | ORAL | Status: DC
  Administered 2021-07-21 – 2021-07-22 (×2): 10 mg via ORAL
  Filled 2021-07-21 (×3): qty 1
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
## 2021-07-21 NOTE — Procedures (Signed)
Interventional Radiology Procedure Note ? ?Procedure: Tunneled right IJ CVC placement ? ?Complications: None ? ?Estimated Blood Loss: < 10 mL ? ?Findings: ?6 Fr, tunneled DL Power Line measuring 21 cm from tip to cuff placed via right IJ with tip at SVC/RA junction. OK to use. ? ?Eulas Post T. Kathlene Cote, M.D ?Pager:  [phone] ? ?  ?

## 2021-07-21 NOTE — Progress Notes (Signed)
FPTS Interim Night Progress Note ? ?S:Patient sleeping comfortably.  Rounded with primary night RN.  No concerns voiced.  No orders required.   ? ?O: ?Today's Vitals  ? 07/20/21 1711 07/20/21 2042 07/20/21 2251 07/21/21 0546  ?BP: 136/88 (!) 143/93  128/85  ?Pulse: 96 86  84  ?Resp: '19 20  18  '$ ?Temp: 98.6 ?F (37 ?C) 98.2 ?F (36.8 ?C)  98.4 ?F (36.9 ?C)  ?TempSrc: Oral Oral  Oral  ?SpO2: 99% 99%  100%  ?Weight:      ?Height:      ?PainSc:   5    ? ? ? ? ?A/P: ?Continue current management. ? ? ?Carollee Leitz MD ?PGY-3, [facility] ?Service pager [phone]   ?
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
## 2021-07-21 NOTE — TOC Progression Note (Signed)
Transition of Care (TOC) - Initial/Assessment Note  ? ? ?Patient Details  ?Name: Melanie Graves ?MRN: [id number] ?Date of Birth: 1986/02/07 ? ?Transition of Care (TOC) CM/SW Contact:    ?Paulene Floor Taylin Leder, LCSWA ?Phone Number: ?07/21/2021, 4:09 PM ? ?Clinical Narrative:                 ?CSW met with the patient at bedside along with the floor RN and RNCM.  The patient was agitated and upset during encounter. The patient had numerous grievances about the Medical staff and care she was receiving at the hospital.  CSW offered patient the number to patient experience, but the patient reported that she had the number already. ? ?CSW inquired about the patient's current living situation.  The patient reported that she lives in a camper outside of a home at [address] [place], [place] [address].  The patient reports that she is from [place].  The patient inquired about [employer] benefits and [place] Medicaid.  CSW informed the patient of the process and how to contact DSS to assist.   ? ?The patient then begin to discuss her current and past medical condition and express a need for home health and TPN.  CSW with the assistance of RNCM explained that home health has to be recommended by the attending MD along with PT and OT.  The patient was adamant that she needed home health, TPN, and pain medication.  The patient reported that she has worked with [facility] and uses [employer]. ? ?TO will continue to follow. ? ?  ?  ? ? ?Patient Goals and CMS Choice ?  ?  ?  ? ?Expected Discharge Plan and Services ?  ?  ?  ?  ?  ?                ?  ?  ?  ?  ?  ?  ?  ?  ?  ?  ? ?Prior Living Arrangements/Services ?  ?  ?  ?       ?  ?  ?  ?  ? ?Activities of Daily Living ?Home Assistive Devices/Equipment: None ?ADL Screening (condition at time of admission) ?Patient's cognitive ability adequate to safely complete daily activities?: Yes ?Is the patient deaf or have difficulty hearing?: No ?Does the patient have difficulty seeing, even when  wearing glasses/contacts?: No ?Does the patient have difficulty concentrating, remembering, or making decisions?: No ?Patient able to express need for assistance with ADLs?: Yes ?Does the patient have difficulty dressing or bathing?: No ?Independently performs ADLs?: No ?Communication: Independent ?Does the patient have difficulty walking or climbing stairs?: Yes ?Weakness of Legs: Both ?Weakness of Arms/Hands: Both ? ?Permission Sought/Granted ?  ?  ?   ?   ?   ?   ? ?Emotional Assessment ?  ?  ?  ?  ?  ?  ? 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 Diagnosis Date Noted  ? Anxiety   ? Adrenal insufficiency ([place])   ? Dehydration   ? Poor social situation   ? Occluded PICC line, initial encounter ([place]) 06/08/2021  ? 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 POTS (postural orthostatic tachycardia syndrome) 06/06/2021  ? Chronic hypokalemia 06/06/2021  ? ADD (attention deficit disorder) 06/06/2021  ? Fracture of 5th metatarsal 06/06/2021  ? ?PCP:  Pcp, No ?  Pharmacy:   ?[facility] ?[place] ?[place] [address] ?Phone: [phone] Fax: [phone] ? ?[facility] ?[address] ?[place] [address] ?Phone: [phone] Fax: [phone] ? ? ? ? ?Social Determinants of Health (SDOH) Interventions ?  ? ?Readmission Risk Interventions ?No flowsheet data found. ? ? ?

## 2021-07-22 ENCOUNTER — Other Ambulatory Visit (HOSPITAL_COMMUNITY): Payer: Self-pay

## 2021-07-22 DIAGNOSIS — E2740 Unspecified adrenocortical insufficiency: Secondary | ICD-10-CM | POA: Diagnosis not present

## 2021-07-22 DIAGNOSIS — F419 Anxiety disorder, unspecified: Secondary | ICD-10-CM | POA: Diagnosis not present

## 2021-07-22 DIAGNOSIS — E440 Moderate protein-calorie malnutrition: Secondary | ICD-10-CM | POA: Diagnosis not present

## 2021-07-22 DIAGNOSIS — D8940 Mast cell activation, unspecified: Secondary | ICD-10-CM | POA: Diagnosis not present

## 2021-07-22 DIAGNOSIS — R0781 Pleurodynia: Secondary | ICD-10-CM

## 2021-07-22 LAB — BASIC METABOLIC PANEL
Anion gap: 8 (ref 5–15)
BUN: 10 mg/dL (ref 6–20)
CO2: 27 mmol/L (ref 22–32)
Calcium: 8.7 mg/dL — ABNORMAL LOW (ref 8.9–10.3)
Chloride: 104 mmol/L (ref 98–111)
Creatinine, Ser: 0.65 mg/dL (ref 0.44–1.00)
GFR, Estimated: >60 mL/min (ref >60)
Glucose, Bld: 114 mg/dL — ABNORMAL HIGH (ref 70–99)
Potassium: 3 mmol/L — ABNORMAL LOW (ref 3.5–5.1)
Sodium: 139 mmol/L (ref 135–145)

## 2021-07-22 LAB — CBC
HCT: 32.6 % — ABNORMAL LOW (ref 36.0–46.0)
Hemoglobin: 10.1 g/dL — ABNORMAL LOW (ref 12.0–15.0)
MCH: 26.6 pg (ref 26.0–34.0)
MCHC: 31 g/dL (ref 30.0–36.0)
MCV: 85.8 fL (ref 80.0–100.0)
Platelets: 374 10*3/uL (ref 150–400)
RBC: 3.8 MIL/uL — ABNORMAL LOW (ref 3.87–5.11)
RDW: 16.1 % — ABNORMAL HIGH (ref 11.5–15.5)
WBC: 10.3 10*3/uL (ref 4.0–10.5)
nRBC: 0 % (ref 0.0–0.2)

## 2021-07-22 LAB — FOLATE: Folate: 8.3 ng/mL (ref >5.9)

## 2021-07-22 LAB — C-REACTIVE PROTEIN: CRP: 0.7 mg/dL (ref <1.0)

## 2021-07-22 LAB — VITAMIN D 25 HYDROXY (VIT D DEFICIENCY, FRACTURES): Vit D, 25-Hydroxy: 85.47 ng/mL (ref 30–100)

## 2021-07-22 LAB — VITAMIN B12: Vitamin B-12: 370 pg/mL (ref 180–914)

## 2021-07-22 MED ORDER — CEPHALEXIN 500 MG PO CAPS
500.0000 mg | ORAL_CAPSULE | Freq: Two times a day (BID) | ORAL | 0 refills | Status: DC
  Filled 2021-07-22: qty 5, 3d supply, fill #0

## 2021-07-22 MED ORDER — HYDROCODONE-ACETAMINOPHEN 5-325 MG PO TABS
1.0000 | ORAL_TABLET | Freq: Four times a day (QID) | ORAL | 0 refills | Status: DC | PRN
  Filled 2021-07-22: qty 8, 2d supply, fill #0

## 2021-07-22 MED ORDER — HYDROCODONE-ACETAMINOPHEN 5-325 MG PO TABS
1.0000 | ORAL_TABLET | ORAL | Status: DC | PRN
  Administered 2021-07-22 (×3): 1 via ORAL
  Filled 2021-07-22 (×3): qty 1

## 2021-07-22 MED ORDER — CEPHALEXIN 500 MG PO CAPS
ORAL_CAPSULE | ORAL | 0 refills | Status: DC
  Filled 2021-07-22: qty 5, fill #0

## 2021-07-22 NOTE — Plan of Care (Signed)

## 2021-07-22 NOTE — Progress Notes (Signed)
FPTS Brief Progress Note ? ?S: Paged by RN as patient was requesting pain medication for pain from her prior rib fracture and foot injury in addition to recent IJ placement. Went to see patient and she states she is unhappy from having to sit in pain and not being offered other pharmacologic methods of controlling her pain. She is requesting a copy of "the policy & procedures for pain management under the state and corporate level" and "you should have access to this 24/7".  ? ?O: ?BP (!) 141/87 (BP Location: Right Arm)   Pulse 96   Temp 98.3 ?F (36.8 ?C) (Oral)   Resp 15   Ht '5\' 7"'$  (1.702 m)   Wt 66.4 kg   SpO2 96%   BMI 22.93 kg/m?   ?Gen: awake, alert, NAD, comfortable-appearing in bed ? ?A/P: ?Pain of multiple locations ?As signed out to me by day team, patient has concerns for her uncontrolled pain. Prior to entering room, patient appeared laying in bed asleep comfortably. Calm to wake upon me entering. It is important to note that R Ankle XR performed on 06/05/21 showed well corticated bone density adjacent to the lateral malleolus felt to reflect old injury or secondary ossification center without visible acute fracture. Rib fracture occurred on 2/23. Narcotics not appropriate in the acute setting for these past injuries however short therapy with recent IJ placement may be of use.  ?- Orders reviewed. Labs for AM ordered, which was adjusted as needed.  ?-Norco 5-325 q4h prn ? ?She continues to be unhappy with her current medical management. I voiced that I will attempt to get her the policy she is requesting. ? ?Wells Guiles, DO ?07/22/2021, 3:21 AM ?PGY-1, [facility] Medicine Night Resident  ?Please page [phone] with questions.  ?  ?

## 2021-07-22 NOTE — Progress Notes (Signed)
[facility] New Face-to-Face Psychiatric Evaluation   Service Date: July 22, 2021 LOS:  LOS: 4 days    Assessment  Melanie Graves is a 36 y.o. female admitted medically for Melanie Graves is a 36 y.o. female admitted medically for 07/18/2021  3:36 PM for catheter access to self administer medication at home. She carries the psychiatric diagnoses of ADD and anxiety and has a past medical history of  MCAS, adrenal insufficiency, POTS noted in EMR, and personally endorses a hx of Lyme disease, solar urticaria, and adrenergic urticaria. Psychiatry was consulted for concern for depression and anxiety by Orvis Brill, DO.      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 She also endorsed significant hx of trauma with continued related symptoms which is concerning for PTSD . She likely meets criteria for PTSD based on assessment although focused interview difficult. Likely does not meet criteria for ADHD; issues with memory and concentration appear to be much more recent and after start of current medication regimen which includes both benadryl and BZD.    Current outpatient psychotropic medications include Klonopin '1mg'$  QID which patient endorses is primarily for MCAS; has had similar medications in the past for anxiety. She is also on vyvanse as an outpt. She was semi compliant with medications prior to admission as evidenced by patient reporting that she must take these medications and PDMP indicates patient is filling prescriptions however UDS was negative for amphetamines. On initial examination, patient is irritable, but willing to participate in psych exam and perseverates on her hx diagnosis of MCAS. Please see plan below for detailed recommendations.   On assessment today patient continues to perseverate on her MCAS and how the associated  symptoms impact her life. Patient also perseverates on treatment related to her diet and pain control. Patient continues to endorse being unhappy with primary team and references her OP physician Dr. Marin Roberts multiple times throughout assessment. Boundary setting with patient had to be done multiple times as she continues to request further access to providers that she "likes." Patient did endorse some mild improvement in insight today, as she did appear to understand that some of her symptoms are attributed to stress response in relation to her feeling distressed by events in her life. Patient was open to starting antidepressant therapy, but again became preoccupied with how this medication would be added to her regimen and appeared to have some apprehension about starting a medication that Dr. Marin Roberts had not started or recommended. 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 There is also concern for PTSD; however would recommend patient continue to f/u with OP provider for dx.   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 This decision is based on my review of the chart including patient's history and current presentation, interview of the patient, mental status examination, and consideration of suicide risk including evaluating suicidal ideation, plan, intent, suicidal or self-harm behaviors, risk factors, and protective factors. This judgment is based on our ability  to directly address suicide risk, implement suicide prevention strategies and develop a safety plan while the patient is in the clinical setting. Please  contact our team if there is a concern that risk level has changed.     ## Medications:  -- Continue her home klonopin '1mg'$  QID, would like to avoid BZD withdrawal - Start Lexparo '5mg'$ , decided not to restart Prozac as patient is currently on Benadryl and both are metabolized through 2D6 and prozac would decrease Benadryl metabolization     ## Medical Decision Making Capacity:  -- Not formally assessed   ## Further Work-up:  -- Per primary     -- most recent EKG on 07/21/2021 had QtC of 452 -- Pertinent labwork reviewed earlier this admission includes: CBC- WBC 13.4/Hgb 10.1, UDS + THC, Cortisol 1.7, Mg 1.9, B- Hcg- (-), Ucx: E. coli   ## Disposition:  -- per primary   ## Behavioral / Environmental:  -- Set appropriate boundaries with patient       Thank you for this consult request. Recommendations have been communicated to the primary team.  We will continue to follow at this time.    PGY-2 Freida Busman, MD   Followup history  Relevant Aspects of Hospital Course:  Admitted on 07/18/2021 for MCAS.  Patient Report:  Upon entering room provider team attempted to set boundaries with patient about care that team can provider and what patient was expecting in regards to her interaction with team. Patient was noted to be eating 2-3 pancakes, and appeared to be doing well. Of note during exam patient would make occasional remarks about how laughter caused her pain, but she was able to make 2 large laughs towards the end without mentioning pain or wincing.    Patient endorses that she is upset with her primary team and continues to endorse that she feels that the communication has not been to her liking. Patient reported that she did have a new line placed yesterday, but unfortunately did not sleep well and attributed this to her rib pain. Patient reports that she does not want providers to think she is drug seeking, but also does not feel that her pain is being taken "seriously." Patient  reports that she did receive Norco for her "rib pain" last night. Despite her trouble patient denies SI, HI, and AVH. Patient and providers discuss with patient her hx of anxiety and patient reports that she does believes that some proportion of her symptoms are related to her mental health. Patient reported that she does recognize that her symptoms started after distressing events. Patient endorsed that she was not against starting SSRI to address her hypervigilance and anxiety related symptoms.   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 Marin Roberts "anticipates" possibility of PTSD Has never seen a psychiatrist - asked for a psychiatrist at [facility] to help with a medicolegal bap. Has never haed a therapist but took kids (her niece) to a therapist.  Was required in court to go through counselling but felt the court assigned counsellor was underqualified   Family psych history: Sister and Brother: SUD     Social History:  Chart review 2021 - PICC line removed d/t infection 2016 - going through divorce   Tobacco  use: Not assessed Alcohol use: Not assessed Drug use: Kratom and THC, denies all other substances Takes "white kratom" occasionally which keeps her up for 2-3 days at a time, took kratom last week.   The patient's family history includes Asthma in her brother; Drug abuse in her sister; Heart disease in her brother; Hepatitis C in her brother; Lumbar disc disease in her mother; Osteoporosis in her mother; Renal cancer in her mother; Skin cancer in her father.    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[place])    solar urticaria    Thyroid disease     Surgical History: Past Surgical History:  Procedure Laterality Date   ABDOMINAL SURGERY     ballon sinus plasty     IR FLUORO GUIDE CV LINE RIGHT  06/08/2021   IR FLUORO GUIDE CV LINE RIGHT  07/21/2021   IR US GUIDE VASC ACCESS RIGHT  07/21/2021    Medications:   Current Facility-Administered Medications:    acetaminophen (TYLENOL) tablet 650 mg, 650 mg, Oral, Q6H PRN, Espinoza, Alejandra, DO, 650 mg at 07/22/21 1213   albuterol (VENTOLIN HFA) 108 (90 Base) MCG/ACT inhaler 1-2 puff, 1-2 puff, Inhalation, Q6H PRN, Espinoza, Alejandra, DO   azelastine (ASTELIN) 0.1 % nasal spray 2 spray, 2 spray, Each Nare, Daily PRN, Nita Sells, Alejandra, DO   cephALEXin (KEFLEX) capsule 500 mg, 500 mg, Oral, BID, Ronnald Ramp, Sarah, DO, 500 mg at 07/22/21 1019   cetirizine (ZYRTEC) tablet 10 mg, 10 mg, Oral, BID, Dameron, Marisa, DO, 10 mg at 07/22/21 1017   clonazePAM (KLONOPIN) tablet 1 mg, 1 mg, Oral, QID, Espinoza, Alejandra, DO, 1 mg at 07/22/21 1016   cromolyn (GASTROCROM) solution 200 mg, 200 mg, Oral, QID, Espinoza, Alejandra, DO, 200 mg at 07/21/21 2213   diphenhydrAMINE (BENADRYL) injection 50 mg, 50 mg, Intravenous, Q6H, Dameron, Marisa, DO, 50 mg at 07/22/21 1030   enoxaparin (LOVENOX) injection 40 mg, 40 mg, Subcutaneous, Q24H, Pham, Minh Q, RPH-CPP, 40 mg at 07/20/21 1524   famotidine (PEPCID) IVPB 20 mg premix, 20 mg, Intravenous, Q12H, Espinoza, Alejandra, DO, Last Rate: 100 mL/hr at 07/22/21 1213, 20 mg at 07/22/21 1213   feeding supplement (KATE FARMS STANDARD 1.4) liquid 325 mL, 325 mL, Oral, BID BM, Hensel, Jamal Collin, MD, 325 mL at 07/22/21 1019   fludrocortisone (FLORINEF) tablet 0.1 mg, 0.1 mg, Oral, Daily, Espinoza, Alejandra, DO, 0.1 mg at 07/22/21 1015   gabapentin (NEURONTIN) tablet 600  mg, 600 mg, Oral, TID, Dameron, Marisa, DO, 600 mg at 07/22/21 1015   HYDROcodone-acetaminophen (NORCO/VICODIN) 5-325 MG per tablet 1 tablet, 1 tablet, Oral, Q4H PRN, Wells Guiles, DO, 1 tablet at 07/22/21 1252   hydrocortisone sodium succinate (SOLU-CORTEF) 100 MG injection 100 mg, 100 mg, Intravenous, Q8H, Welborn, Ryan, DO, 100 mg at 07/22/21 1016   hydrOXYzine (ATARAX) tablet 100 mg, 100 mg, Oral, BID, Espinoza, Alejandra, DO, 100 mg at 07/22/21 1014   ipratropium-albuterol (DUONEB) 0.5-2.5 (3) MG/3ML nebulizer solution 3 mL, 3 mL, Nebulization, Q4H PRN, Espinoza, Alejandra, DO   lidocaine (LIDODERM) 5 % 1 patch, 1 patch, Transdermal, Q24H, Espinoza, Alejandra, DO, 1 patch at 07/21/21 2148   mometasone-formoterol (DULERA) 200-5 MCG/ACT inhaler 2 puff, 2 puff, Inhalation, BID, Espinoza, Alejandra, DO,  2 puff at 07/22/21 0824   promethazine (PHENERGAN) 25 mg in sodium chloride 0.9 % 50 mL IVPB, 25 mg, Intravenous, Q6H, Welborn, Ryan, DO, Last Rate: 200 mL/hr at 07/22/21 0831, 25 mg at 07/22/21 0831   tiZANidine (ZANAFLEX) tablet 4 mg, 4 mg, Oral, Daily, Espinoza, Alejandra, DO, 4 mg at 07/22/21 1016  Allergies: Allergies  Allergen Reactions   Barley Grass Hives   Beta Adrenergic Blockers Other (See Comments)    Patient states it is contraindicated for her.    Chlorhexidine Hives   Fluconazole Other (See Comments)    Fixed drug eruptuions blisters "Autoimmune fixed drug eruptions" - can take it but has to take zyrtec and zantac prior    Mustard Seed Hives and Itching   Risperdal [Risperidone]     Adverse reactions   Tomato Hives       Objective  Vital signs:  Temp:  [97.6 F (36.4 C)-98.3 F (36.8 C)] 98.2 F (36.8 C) (03/08 0919) Pulse Rate:  [87-99] 96 (03/08 0919) Resp:  [14-20] 17 (03/08 0919) BP: (133-159)/(82-98) 159/98 (03/08 0919) SpO2:  [96 %-100 %] 100 % (03/08 0919)  Psychiatric Specialty Exam:  Presentation  General Appearance: Casual (full  face of makeup)  Eye Contact:Fair  Speech:Clear and Coherent (a bit rushed)  Speech Volume:Normal  Handedness:No data recorded  Mood and Affect  Mood:Irritable; Anxious  Affect:Congruent   Thought Process  Thought Processes:Goal Directed  Descriptions of Associations:Circumstantial  Orientation:Full (Time, Place and Person)  Thought Content:Perseveration  History of Schizophrenia/Schizoaffective disorder:No data recorded Duration of Psychotic Symptoms:No data recorded Hallucinations:Hallucinations: None  Ideas of Reference:None  Suicidal Thoughts:Suicidal Thoughts: No  Homicidal Thoughts:Homicidal Thoughts: No   Sensorium  Memory:Immediate Fair; Recent Fair; Remote Good  Judgment:Impaired  Insight:Shallow   Executive Functions  Concentration:Fair  Attention Span:Poor  Recall:No data recorded Fund of Knowledge:Fair  Language:Fair   Psychomotor Activity  Psychomotor Activity:Psychomotor Activity: Increased   Assets  Assets:Resilience   Sleep  Sleep:Sleep: Poor    Physical Exam: Physical Exam HENT:     Head: Normocephalic and atraumatic.  Pulmonary:     Effort: Pulmonary effort is normal.  Neurological:     Mental Status: She is alert.   Review of Systems  Psychiatric/Behavioral:  Negative for hallucinations and suicidal ideas.   Blood pressure (!) 159/98, pulse 96, temperature 98.2 F (36.8 C), temperature source Oral, resp. rate 17, height '5\' 7"'$  (1.702 m), weight 66.4 kg, SpO2 100 %. Body mass index is 22.93 kg/m.

## 2021-07-22 NOTE — Progress Notes (Deleted)
[facility] Discharge Summary  Patient name: Melanie Graves Medical record number: [id number] Date of birth: 06-22-1985 Age: 36 y.o. 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 Skin: Bruising over lower extremities. IJ-tunneled catheter over right side with dry dressing intact  Brief Hospital Course:  Melanie Graves is a 36 y.o. female who was admitted to [facility] on 3/4 after PICC line was dislodged at home. PMHx is significant for mast cell activation syndrome, POTS, ADD, anxiety, adrenal insufficiency, asthma. Hospital course is listed below by problem.   Mast Cell Activation Syndrome Dislodged PICC line  Patient admitted for re-insertion of central line which she had been using to self administer medications for reported [place]. In ED, CBC- WBC 13.4/Hgb 10.1, UDS + THC, Cortisol 1.7, Mg 1.9, B- Hcg- (-), Ucx: E. Coli. She had no acute complaints on presentation other then feeling nauseous and diaphoretic from not having fluids. She was started back on her home regimen of IV Solu-cortef, fludrocortisone, IV pepcid, IV benadryl, IV phenergan. IR was consulted and placed an IJ tunneled catheter under general anesthesia. Tolerated well. RD  consulted, provided patient with nutrition needs. Discharged in stable condition and to resume normal medication regimen per her allergist (Dr. Marin Roberts located in [place],  [place]).  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 Patient reports that she came to [facility] because she felt that no on in [place] would help her. 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 She also endorsed significant hx of trauma with continued related symptoms which is concerning for PTSD . She likely meets criteria for PTSD based on assessment although focused interview difficult. Likely does not meet criteria for ADHD; issues with memory and concentration appear to be much more recent and after start of current medication regimen which includes both benadryl and BZD.  Recommendation was to wean Klonipin over a taper, discontinue Vyvanse and start Lexapro 71m daily.  Urinary tract infection UA wnl, but urine culture with >100,000 E.coli. Treated with Keflex, to be completed on 07/24/21.  COVID-19 infection Diagnosed on 2/23 at [facility] Received Paxlovid previously. Asymptomatic while admitted.   Social Barriers Significant social barriers including housing insecurity, financial insecurity, poor social support, PCP needs. TOC was consulted.   Other chronic conditions stable -Asthma, allergies -POTS -ADD, Anxiety  PCP follow-up Ensure completion of Keflex for UTI Wean and taper Klonipin over months Initiate SSRI if patient desires recommendation for Lexapro 547mdaily Ensure IJ tunneled catheter without sign of infection given hx central line infection    Significant Procedures: IR   Significant Labs and Imaging:  Recent Labs  Lab 07/18/21 1657 07/18/21 1724 07/21/21 0547 07/22/21 1036  WBC 14.0*  --  13.4* 10.3  HGB 11.4* 11.9* 10.1* 10.1*   HCT 36.6 35.0*  31.9* 32.6*  PLT 490*  --  382 374   Recent Labs  Lab 07/18/21 1657 07/18/21 1724 07/19/21 0601 07/21/21 0547 07/22/21 1036  NA 140 139 139  --  139  K 3.9 3.8 3.8  --  3.0*  CL 101 99 101  --  104  CO2 31  --  31  --  27  GLUCOSE 85 81 129*  --  114*  BUN 8 9 7   --  10  CREATININE 0.68 0.70 0.66  --  0.65  CALCIUM 9.2  --  8.6*  --  8.7*  MG 1.9  --   --  2.2  --   ALKPHOS 70  --   --   --   --   AST 20  --   --   --   --   ALT 23  --   --   --   --   ALBUMIN 3.0*  --   --   --   --     Results/Tests Pending at Time of Discharge: None  Discharge Medications:  Allergies as of 07/22/2021       Reactions   Barley Grass Hives   Beta Adrenergic Blockers Other (See Comments)   Patient states it is contraindicated for her.    Chlorhexidine Hives   Fluconazole Other (See Comments)   Fixed drug eruptuions blisters "Autoimmune fixed drug eruptions" - can take it but has to take zyrtec and zantac prior   Mustard Seed Hives, Itching   Risperdal [risperidone]    Adverse reactions   Tomato Hives        Medication List     STOP taking these medications    amoxicillin-clavulanate 875-125 MG tablet Commonly known as: AUGMENTIN   cefUROXime 500 MG tablet Commonly known as: CEFTIN   lactated ringers infusion   lisdexamfetamine 20 MG capsule Commonly known as: VYVANSE   mupirocin ointment 2 % Commonly known as: BACTROBAN   Normal Saline Flush 0.9 % Soln   Paxlovid (300/100) 20 x 150 MG & 10 x 100MG Tbpk Generic drug: nirmatrelvir & ritonavir   potassium chloride 10 MEQ tablet Commonly known as: KLOR-CON   promethazine 25 mg in sodium chloride 0.9 % 1,000 mL   promethazine 25 MG tablet Commonly known as: PHENERGAN   Vitamin D3 125 MCG (5000 UT) Caps       TAKE these medications    albuterol 108 (90 Base) MCG/ACT inhaler Commonly known as: VENTOLIN HFA Inhale 1-2 puffs into the lungs every 6 (six) hours as needed for wheezing or  shortness of breath.   ascorbic acid 1000 MG tablet Commonly known as: VITAMIN C Take 1,000 mg by mouth daily as needed (immune support).   azelastine 0.1 % nasal spray Commonly known as: ASTELIN Place 2 sprays into both nostrils daily as needed for rhinitis or allergies.   Cathflo Activase 2 MG injection Generic drug: alteplase 2 mg by Intracatheter route daily as needed for open catheter.   cephALEXin 500 MG capsule Commonly known as: KEFLEX Take one capsule by mouth twice daily. What changed:  how much to take how to take this when to take this additional instructions   cetirizine 10 MG tablet Commonly known as: ZYRTEC Take 10 mg by mouth in the morning and at bedtime. What changed: Another medication with the same name was removed. Continue taking this medication, and follow the directions you see here.   clemastine 2.68 MG Tabs tablet Commonly known as:  TAVIST Take 2.68 mg by mouth in the morning and at bedtime.   clonazePAM 1 MG tablet Commonly known as: KLONOPIN Take 1 mg by mouth 4 (four) times daily.   cloNIDine 0.1 MG tablet Commonly known as: CATAPRES Take 0.1 mg by mouth daily as needed (high blood pressure).   cromolyn 100 MG/5ML solution Commonly known as: GASTROCROM Take 200 mg by mouth 4 (four) times daily.   diphenhydrAMINE 50 mg in sodium chloride 0.9 % 50 mL Inject 50 mg into the vein See admin instructions. 3-4 times daily   EPINEPHrine 1 MG/ML Soln Commonly known as: ADRENALIN Inject 0.3 mLs into the muscle as needed for anaphylaxis.   EQ Anti-Diarrheal 2 MG tablet Generic drug: loperamide Take 2 mg by mouth 4 (four) times daily as needed for diarrhea or loose stools.   famotidine 40 MG tablet Commonly known as: PEPCID Take 40 mg by mouth daily as needed (reflux/to prevent anaphylaxis). What changed: Another medication with the same name was removed. Continue taking this medication, and follow the directions you see here.    fludrocortisone 0.1 MG tablet Commonly known as: FLORINEF Take 0.1 mg by mouth daily.   gabapentin 600 MG tablet Commonly known as: NEURONTIN Take 600 mg by mouth 3 (three) times daily.   HYDROcodone-acetaminophen 5-325 MG tablet Commonly known as: NORCO/VICODIN Take 1 tablet by mouth every 6 (six) hours as needed. What changed:  when to take this Another medication with the same name was removed. Continue taking this medication, and follow the directions you see here.   hydrOXYzine 25 MG capsule Commonly known as: VISTARIL Take 100 mg by mouth in the morning and at bedtime.   ipratropium-albuterol 0.5-2.5 (3) MG/3ML Soln Commonly known as: DUONEB Take 3 mLs by nebulization every 4 (four) hours as needed (shortness of breath).   ketotifen 0.025 % ophthalmic solution Commonly known as: ZADITOR Place 1 drop into both eyes in the morning and at bedtime.   naloxone 4 MG/0.1ML Liqd nasal spray kit Commonly known as: NARCAN Place 4 mg into the nose daily as needed (opoid overdose).   Nurtec 75 MG Tbdp Generic drug: Rimegepant Sulfate Take 75 mg by mouth daily as needed (migraine).   ondansetron 8 MG disintegrating tablet Commonly known as: ZOFRAN-ODT Take 8 mg by mouth every 8 (eight) hours as needed for nausea or vomiting.   Qnasl 80 MCG/ACT Aers Generic drug: Beclomethasone Dipropionate Place 2 sprays into both nostrils in the morning and at bedtime.   rizatriptan 10 MG tablet Commonly known as: MAXALT Take 10 mg by mouth as needed for migraine.   SOLU-CORTEF IJ Inject 100 mg as directed See admin instructions. 2-4 times daily   spironolactone 25 MG tablet Commonly known as: ALDACTONE Take 25 mg by mouth daily.   Symbicort 160-4.5 MCG/ACT inhaler Generic drug: budesonide-formoterol Inhale 2 puffs into the lungs in the morning and at bedtime.   tezepelumab-ekko 210 MG/1.91ML syringe Commonly known as: TEZSPIRE Inject 210 mg into the skin every 30 (thirty)  days.   tiZANidine 4 MG tablet Commonly known as: ZANAFLEX Take 4 mg by mouth in the morning and at bedtime.   Vitamin D (Ergocalciferol) 1.25 MG (50000 UNIT) Caps capsule Commonly known as: DRISDOL Take 50,000 Units by mouth 2 (two) times a week. No set days   Xolair 75 MG/0.5ML prefilled syringe Generic drug: omalizumab Inject 375 mg into the skin every 14 (fourteen) days.   zinc gluconate 50 MG tablet Take 50 mg by mouth daily  as needed (immune support).        Discharge Instructions: Please refer to Patient Instructions section of EMR for full details.  Patient was counseled important signs and symptoms that should prompt return to medical care, changes in medications, dietary instructions, activity restrictions, and follow up appointments.   Follow-Up Appointments:   Orvis Brill, DO 07/22/2021, 2:10 PM PGY-1, [place]

## 2021-07-22 NOTE — Progress Notes (Signed)
Discharge instructions (including medications) discussed with and copy provided to patient/caregiver 

## 2021-07-22 NOTE — Discharge Instructions (Addendum)
There is no specific diet, however there are basic guidelines. Using an elimination diet can be helpful to determine food triggers. Mast cell disease patients often report symptom improvement with dietary changes; however, these changes vary from person to person. Identifying helpful changes takes some experimentation. There are foods that patients with mast cell disease seems to be more reactive to overall. These include but are not limited to: Monosodium Glutamate (MSG), alcohol, shellfish, artificial food dyes and flavorings, food preservatives, pineapples, tomatoes & tomato based products, and chocolate. While these foods commonly trigger a lot of patients, they may not be an issue for everyone. That is why an elimination diet is recommended. With the help of a registered dietitian, you can start off with a limited diet and then slowly introduce each food while keeping a written record of what foods you eat and any symptoms that occur. This can help you identify and eliminate food-based triggers from your diet. If you seem to be reacting to all foods, you might be reacting to the act of eating rather than specific foods. It would be best in this case to find a physician to help you with a plan of action to reduce overall mast cell reactions. Once you get your mast cells under control, you can re-evaluate your diet for potential triggers ? ? ?Recommend trying Neocate Splash (237 kcals, 7 grams protein per carton)  ?
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
## 2021-07-22 NOTE — Plan of Care (Signed)
  Problem: Education: Goal: Knowledge of General Education information will improve Description: Including pain rating scale, medication(s)/side effects and non-pharmacologic comfort measures Outcome: Adequate for Discharge   

## 2021-07-22 NOTE — TOC Transition Note (Signed)
Transition of Care (TOC) - CM/SW Discharge Note ? ? ?Patient Details  ?Name: Melanie Graves ?MRN: [id number] ?Date of Birth: Jul 08, 1985 ? ?Transition of Care (TOC) CM/SW Contact:  ?Tom-Johnson, Renea Ee, RN ?Phone Number: ?07/22/2021, 3:34 PM ? ? ?Clinical Narrative:    ? ?Patient is scheduled for discharge today. Hospital f/u scheduled and information on AVS. Other PCP resources given to patient at bedside. Patient states she will see her Immunologist, Dr. Lake Bells in the next week. States she has Central line supplies and able to get some when needed. Family to transport at discharge. No further TOC needs noted. ? ?Final next level of care: Home/Self Care ?Barriers to Discharge: Barriers Resolved ? ? ?Patient Goals and CMS Choice ?Patient states their goals for this hospitalization and ongoing recovery are:: To return home/[place] ?CMS Medicare.gov Compare Post Acute Care list provided to:: Patient ?Choice offered to / list presented to : NA ? ?Discharge Placement ?  ?           ?  ?Patient to be transferred to facility by: Family ?  ?  ? ?Discharge Plan and Services ?  ?  ?           ?DME Arranged: N/A ?DME Agency: NA ?  ?  ?  ?HH Arranged: NA ?[place] Agency: NA ?  ?  ?  ? ?Social Determinants of Health (SDOH) Interventions ?  ? ? ?Readmission Risk Interventions ?No flowsheet data found. ? ? ? ? ?

## 2021-07-22 NOTE — Discharge Summary (Addendum)
[facility] Discharge Summary  Patient name: Melanie Graves Medical record number: [id number] Date of birth: [birth year] Age: 36 y.o. 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 Skin: Bruising over lower extremities. IJ-tunneled catheter over right side with dry dressing intact  Brief Hospital Course:  Melanie Graves is a 36 y.o. female who was admitted to [facility] on 3/4 after PICC line was dislodged at home. PMHx is significant for mast cell activation syndrome, POTS, ADD, anxiety, adrenal insufficiency, asthma. Hospital course is listed below by problem.   Mast Cell Activation Syndrome Dislodged PICC line  Patient admitted for re-insertion of central line which she had been using to self administer medications for reported [place]. In ED, CBC- WBC 13.4/Hgb 10.1, UDS + THC, Cortisol 1.7, Mg 1.9, B- Hcg- (-), Ucx: E. Coli. She had no acute complaints on presentation other then feeling nauseous and diaphoretic from not having fluids. She was started back on her home regimen of IV Solu-cortef, fludrocortisone, IV pepcid, IV benadryl, IV phenergan. IR was consulted and placed an IJ tunneled catheter under general anesthesia. Tolerated well. RD  consulted, provided patient with nutrition needs. Discharged in stable condition and to resume normal medication regimen per her allergist (Dr. Marin Roberts located in [place],  [place]).  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 Patient reports that she came to [facility] because she felt that no on in [place] would help her. 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 She also endorsed significant hx of trauma with continued related symptoms which is concerning for PTSD . She likely meets criteria for PTSD based on assessment although focused interview difficult. Likely does not meet criteria for ADHD; issues with memory and concentration appear to be much more recent and after start of current medication regimen which includes both benadryl and BZD.  Recommendation was to wean Klonipin over a taper, discontinue Vyvanse and start Lexapro 28m daily. Also recommended outpatient psychiatry follow-up.  Urinary tract infection UA wnl, but urine culture with >100,000 E.coli. Treated with Keflex, to be completed on 07/24/21.  [place] [facility] Received Paxlovid previously. Asymptomatic while admitted.   Social Barriers Significant social barriers including housing insecurity, financial insecurity, poor social support, PCP needs. TOC was consulted.   Other chronic conditions stable -Asthma, allergies -POTS -ADD, Anxiety  PCP follow-up Ensure completion of Keflex for UTI Wean and taper Klonipin over months Initiate SSRI if patient desires recommendation for Lexapro 557mdaily Ensure IJ tunneled catheter without sign of infection given hx central line infection Outpatient psychiatry follow-up recommended    Significant Procedures: IR   Significant Labs and Imaging:  Recent Labs  Lab 07/18/21 1657  07/18/21 1724 07/21/21 0547 07/22/21 1036  WBC 14.0*  --  13.4* 10.3  HGB 11.4* 11.9* 10.1* 10.1*  HCT 36.6 35.0* 31.9* 32.6*  PLT 490*  --  382 374   Recent Labs  Lab 07/18/21 1657 07/18/21 1724 07/19/21 0601 07/21/21 0547 07/22/21 1036  NA 140 139 139  --  139  K 3.9 3.8 3.8  --  3.0*  CL 101 99 101  --  104  CO2 31  --  31  --  27  GLUCOSE 85 81 129*  --  114*  BUN 8 9 7   --  10  CREATININE 0.68 0.70 0.66  --  0.65  CALCIUM 9.2  --  8.6*  --  8.7*  MG 1.9  --   --  2.2  --   ALKPHOS 70  --   --   --   --   AST 20  --   --   --   --   ALT 23  --   --   --   --   ALBUMIN 3.0*  --   --   --   --     Results/Tests Pending at Time of Discharge: None  Discharge Medications:  Allergies as of 07/22/2021       Reactions   Barley Grass Hives   Beta Adrenergic Blockers Other (See Comments)   Patient states it is contraindicated for her.    Chlorhexidine Hives   Fluconazole Other (See Comments)   Fixed drug eruptuions blisters "Autoimmune fixed drug eruptions" - can take it but has to take zyrtec and zantac prior   Mustard Seed Hives, Itching   Risperdal [risperidone]    Adverse reactions   Tomato Hives        Medication List     STOP taking these medications    amoxicillin-clavulanate 875-125 MG tablet Commonly known as: AUGMENTIN   cefUROXime 500 MG tablet Commonly known as: CEFTIN   lactated ringers infusion   lisdexamfetamine 20 MG capsule Commonly known as: VYVANSE   mupirocin ointment 2 % Commonly known as: BACTROBAN   Normal Saline Flush 0.9 % Soln   Paxlovid (300/100) 20 x 150 MG & 10 x 100MG Tbpk Generic drug: nirmatrelvir & ritonavir   potassium chloride 10 MEQ tablet Commonly known as: KLOR-CON   promethazine 25 mg in sodium chloride 0.9 % 1,000 mL   promethazine 25 MG tablet Commonly known as: PHENERGAN   Vitamin D3 125 MCG (5000 UT) Caps       TAKE these medications    albuterol 108 (90 Base) MCG/ACT inhaler Commonly known  as: VENTOLIN HFA Inhale 1-2 puffs into the lungs every 6 (six) hours as needed for wheezing or shortness of breath.   ascorbic acid 1000 MG tablet Commonly known as: VITAMIN C Take 1,000 mg by mouth daily as needed (immune support).   azelastine 0.1 % nasal spray Commonly known as: ASTELIN Place 2 sprays into both nostrils daily as needed for rhinitis or allergies.   Cathflo Activase 2 MG injection Generic drug: alteplase 2 mg by Intracatheter route daily as needed for open catheter.   cephALEXin 500 MG capsule Commonly known as: KEFLEX Take one capsule by mouth twice daily. What changed:  how much to take how to take this when to take this additional instructions   cetirizine 10 MG tablet Commonly known as: ZYRTEC Take 10 mg by mouth in the morning and at bedtime. What changed: Another medication with the same name was removed. Continue taking this medication, and follow the directions you see here.  clemastine 2.68 MG Tabs tablet Commonly known as: TAVIST Take 2.68 mg by mouth in the morning and at bedtime.   clonazePAM 1 MG tablet Commonly known as: KLONOPIN Take 1 mg by mouth 4 (four) times daily.   cloNIDine 0.1 MG tablet Commonly known as: CATAPRES Take 0.1 mg by mouth daily as needed (high blood pressure).   cromolyn 100 MG/5ML solution Commonly known as: GASTROCROM Take 200 mg by mouth 4 (four) times daily.   diphenhydrAMINE 50 mg in sodium chloride 0.9 % 50 mL Inject 50 mg into the vein See admin instructions. 3-4 times daily   EPINEPHrine 1 MG/ML Soln Commonly known as: ADRENALIN Inject 0.3 mLs into the muscle as needed for anaphylaxis.   EQ Anti-Diarrheal 2 MG tablet Generic drug: loperamide Take 2 mg by mouth 4 (four) times daily as needed for diarrhea or loose stools.   famotidine 40 MG tablet Commonly known as: PEPCID Take 40 mg by mouth daily as needed (reflux/to prevent anaphylaxis). What changed: Another medication with the same name was  removed. Continue taking this medication, and follow the directions you see here.   fludrocortisone 0.1 MG tablet Commonly known as: FLORINEF Take 0.1 mg by mouth daily.   gabapentin 600 MG tablet Commonly known as: NEURONTIN Take 600 mg by mouth 3 (three) times daily.   HYDROcodone-acetaminophen 5-325 MG tablet Commonly known as: NORCO/VICODIN Take 1 tablet by mouth every 6 (six) hours as needed. What changed:  when to take this Another medication with the same name was removed. Continue taking this medication, and follow the directions you see here.   hydrOXYzine 25 MG capsule Commonly known as: VISTARIL Take 100 mg by mouth in the morning and at bedtime.   ipratropium-albuterol 0.5-2.5 (3) MG/3ML Soln Commonly known as: DUONEB Take 3 mLs by nebulization every 4 (four) hours as needed (shortness of breath).   ketotifen 0.025 % ophthalmic solution Commonly known as: ZADITOR Place 1 drop into both eyes in the morning and at bedtime.   naloxone 4 MG/0.1ML Liqd nasal spray kit Commonly known as: NARCAN Place 4 mg into the nose daily as needed (opoid overdose).   Nurtec 75 MG Tbdp Generic drug: Rimegepant Sulfate Take 75 mg by mouth daily as needed (migraine).   ondansetron 8 MG disintegrating tablet Commonly known as: ZOFRAN-ODT Take 8 mg by mouth every 8 (eight) hours as needed for nausea or vomiting.   Qnasl 80 MCG/ACT Aers Generic drug: Beclomethasone Dipropionate Place 2 sprays into both nostrils in the morning and at bedtime.   rizatriptan 10 MG tablet Commonly known as: MAXALT Take 10 mg by mouth as needed for migraine.   SOLU-CORTEF IJ Inject 100 mg as directed See admin instructions. 2-4 times daily   spironolactone 25 MG tablet Commonly known as: ALDACTONE Take 25 mg by mouth daily.   Symbicort 160-4.5 MCG/ACT inhaler Generic drug: budesonide-formoterol Inhale 2 puffs into the lungs in the morning and at bedtime.   tezepelumab-ekko 210 MG/1.91ML  syringe Commonly known as: TEZSPIRE Inject 210 mg into the skin every 30 (thirty) days.   tiZANidine 4 MG tablet Commonly known as: ZANAFLEX Take 4 mg by mouth in the morning and at bedtime.   Vitamin D (Ergocalciferol) 1.25 MG (50000 UNIT) Caps capsule Commonly known as: DRISDOL Take 50,000 Units by mouth 2 (two) times a week. No set days   Xolair 75 MG/0.5ML prefilled syringe Generic drug: omalizumab Inject 375 mg into the skin every 14 (fourteen) days.   zinc gluconate 50  MG tablet Take 50 mg by mouth daily as needed (immune support).        Discharge Instructions: Please refer to Patient Instructions section of EMR for full details.  Patient was counseled important signs and symptoms that should prompt return to medical care, changes in medications, dietary instructions, activity restrictions, and follow up appointments.   Follow-Up Appointments:   Orvis Brill, DO 07/22/2021, 3:02 PM PGY-1, [place]

## 2021-07-23 LAB — MISC LABCORP TEST (SEND OUT)

## 2021-07-23 LAB — SELENIUM SERUM: Selenium: 120 ug/L (ref 93–198)

## 2021-07-24 LAB — VITAMIN B6: Vitamin B6: 2.3 ug/L — ABNORMAL LOW (ref 3.4–65.2)

## 2021-07-24 LAB — VITAMIN C: Vitamin C: 0.6 mg/dL (ref 0.4–2.0)

## 2021-07-24 LAB — ZINC: Zinc: 50 ug/dL (ref 44–115)

## 2021-07-25 LAB — VITAMIN B1: Vitamin B1 (Thiamine): 120 nmol/L (ref 66.5–200.0)

## 2021-07-25 LAB — VITAMIN K1, SERUM: VITAMIN K1: 0.28 ng/mL (ref 0.10–2.20)

## 2021-07-29 LAB — VITAMIN E
Vitamin E (Alpha Tocopherol): 10.2 mg/L (ref 5.9–19.4)
Vitamin E(Gamma Tocopherol): 0.7 mg/L (ref 0.7–4.9)

## 2021-07-29 LAB — VITAMIN A: Vitamin A (Retinoic Acid): 56.9 ug/dL (ref 18.9–57.3)

## 2021-08-25 ENCOUNTER — Inpatient Hospital Stay: Payer: Medicaid Other | Admitting: Internal Medicine

## 2021-09-08 ENCOUNTER — Emergency Department (HOSPITAL_COMMUNITY): Payer: Medicaid Other

## 2021-09-08 ENCOUNTER — Encounter (HOSPITAL_COMMUNITY): Payer: Self-pay

## 2021-09-08 ENCOUNTER — Other Ambulatory Visit: Payer: Self-pay

## 2021-09-08 ENCOUNTER — Emergency Department (HOSPITAL_COMMUNITY)
Admission: EM | Admit: 2021-09-08 | Discharge: 2021-09-08 | Disposition: A | Discharge status: Home / Self Care | Payer: Medicaid Other | Attending: Emergency Medicine | Admitting: Emergency Medicine

## 2021-09-08 DIAGNOSIS — S0083XA Contusion of other part of head, initial encounter: Secondary | ICD-10-CM | POA: Diagnosis not present

## 2021-09-08 DIAGNOSIS — S0990XA Unspecified injury of head, initial encounter: Secondary | ICD-10-CM | POA: Diagnosis present

## 2021-09-08 DIAGNOSIS — S8011XA Contusion of right lower leg, initial encounter: Secondary | ICD-10-CM | POA: Diagnosis not present

## 2021-09-08 DIAGNOSIS — S5012XA Contusion of left forearm, initial encounter: Secondary | ICD-10-CM | POA: Diagnosis not present

## 2021-09-08 DIAGNOSIS — S8012XA Contusion of left lower leg, initial encounter: Secondary | ICD-10-CM | POA: Insufficient documentation

## 2021-09-08 DIAGNOSIS — S5011XA Contusion of right forearm, initial encounter: Secondary | ICD-10-CM | POA: Insufficient documentation

## 2021-09-08 LAB — CBG MONITORING, ED: Glucose-Capillary: 108 mg/dL — ABNORMAL HIGH (ref 70–99)

## 2021-09-08 MED ORDER — IBUPROFEN 800 MG PO TABS: 800.0000 mg | ORAL_TABLET | Freq: Three times a day (TID) | ORAL | 1 refills | Status: DC | PRN

## 2021-09-08 NOTE — Discharge Instructions (Signed)
Follow up if not improving
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
## 2021-12-26 ENCOUNTER — Emergency Department (HOSPITAL_COMMUNITY): Payer: Medicaid Other

## 2021-12-26 ENCOUNTER — Other Ambulatory Visit: Payer: Self-pay

## 2021-12-26 ENCOUNTER — Encounter (HOSPITAL_COMMUNITY): Payer: Self-pay

## 2021-12-26 ENCOUNTER — Emergency Department (HOSPITAL_COMMUNITY)
Admission: EM | Admit: 2021-12-26 | Discharge: 2021-12-26 | Disposition: A | Discharge status: Home / Self Care | Payer: Medicaid Other | Attending: Emergency Medicine | Admitting: Emergency Medicine

## 2021-12-26 DIAGNOSIS — L0390 Cellulitis, unspecified: Secondary | ICD-10-CM | POA: Insufficient documentation

## 2021-12-26 DIAGNOSIS — R531 Weakness: Secondary | ICD-10-CM | POA: Insufficient documentation

## 2021-12-26 LAB — CBC WITH DIFFERENTIAL/PLATELET
Abs Immature Granulocytes: 0.02 10*3/uL (ref 0.00–0.07)
Basophils Absolute: 0 10*3/uL (ref 0.0–0.1)
Basophils Relative: 0 %
Eosinophils Absolute: 0.1 10*3/uL (ref 0.0–0.5)
Eosinophils Relative: 1 %
HCT: 38.8 % (ref 36.0–46.0)
Hemoglobin: 11.5 g/dL — ABNORMAL LOW (ref 12.0–15.0)
Immature Granulocytes: 0 %
Lymphocytes Relative: 34 %
Lymphs Abs: 3.4 10*3/uL (ref 0.7–4.0)
MCH: 22.5 pg — ABNORMAL LOW (ref 26.0–34.0)
MCHC: 29.6 g/dL — ABNORMAL LOW (ref 30.0–36.0)
MCV: 76.1 fL — ABNORMAL LOW (ref 80.0–100.0)
Monocytes Absolute: 0.6 10*3/uL (ref 0.1–1.0)
Monocytes Relative: 6 %
Neutro Abs: 6 10*3/uL (ref 1.7–7.7)
Neutrophils Relative %: 59 %
Platelets: 490 10*3/uL — ABNORMAL HIGH (ref 150–400)
RBC: 5.1 MIL/uL (ref 3.87–5.11)
RDW: 19.8 % — ABNORMAL HIGH (ref 11.5–15.5)
WBC: 10.1 10*3/uL (ref 4.0–10.5)
nRBC: 0 % (ref 0.0–0.2)

## 2021-12-26 LAB — LACTIC ACID, PLASMA
Lactic Acid, Venous: 0.7 mmol/L (ref 0.5–1.9)
Lactic Acid, Venous: 1.1 mmol/L (ref 0.5–1.9)

## 2021-12-26 LAB — COMPREHENSIVE METABOLIC PANEL
ALT: 20 U/L (ref 0–44)
AST: 21 U/L (ref 15–41)
Albumin: 4.1 g/dL (ref 3.5–5.0)
Alkaline Phosphatase: 90 U/L (ref 38–126)
Anion gap: 9 (ref 5–15)
BUN: 8 mg/dL (ref 6–20)
CO2: 25 mmol/L (ref 22–32)
Calcium: 9.4 mg/dL (ref 8.9–10.3)
Chloride: 102 mmol/L (ref 98–111)
Creatinine, Ser: 0.68 mg/dL (ref 0.44–1.00)
GFR, Estimated: >60 mL/min (ref >60)
Glucose, Bld: 97 mg/dL (ref 70–99)
Potassium: 3.5 mmol/L (ref 3.5–5.1)
Sodium: 136 mmol/L (ref 135–145)
Total Bilirubin: 0.4 mg/dL (ref 0.3–1.2)
Total Protein: 8.5 g/dL — ABNORMAL HIGH (ref 6.5–8.1)

## 2021-12-26 LAB — PROTIME-INR
INR: 1 (ref 0.8–1.2)
Prothrombin Time: 12.6 seconds (ref 11.4–15.2)

## 2021-12-26 LAB — LIPASE, BLOOD: Lipase: 26 U/L (ref 11–51)

## 2021-12-26 MED ORDER — ONDANSETRON HCL 4 MG/2ML IJ SOLN
4.0000 mg | Freq: Once | INTRAMUSCULAR | Status: AC
  Administered 2021-12-26: 4 mg via INTRAVENOUS
  Filled 2021-12-26: qty 2

## 2021-12-26 MED ORDER — DOXYCYCLINE HYCLATE 100 MG PO CAPS: 100.0000 mg | ORAL_CAPSULE | Freq: Two times a day (BID) | ORAL | 0 refills | Status: DC

## 2021-12-26 MED ORDER — LACTATED RINGERS IV BOLUS
1000.0000 mL | Freq: Once | INTRAVENOUS | Status: AC
  Administered 2021-12-26: 1000 mL via INTRAVENOUS

## 2021-12-26 MED ORDER — HYDROMORPHONE HCL 1 MG/ML IJ SOLN
1.0000 mg | Freq: Once | INTRAMUSCULAR | Status: AC
  Administered 2021-12-26: 1 mg via INTRAVENOUS
  Filled 2021-12-26: qty 1
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
## 2021-12-26 NOTE — Discharge Instructions (Addendum)
[facility] [id number]  [phone]  [place] [address]  [phone]

## 2021-12-26 NOTE — ED Notes (Signed)
Discharge summary reviewed, pt verbalizes understanding. Ambulatory off unit.
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
## 2021-12-26 NOTE — ED Provider Notes (Signed)
I was notified that staff did attempt to pull the patient's line out.  They were unable to do so.  Records revealed and this is a tunneled central venous hemodialysis catheter placed with ultrasound and fluoroscopic guidance.  I spoke with Dr. Kathlene Cote who did actually place this back in March.  He states as long as the cuff is still in place there is no harm in leaving the line in place.  If the cuff had been pulled out then the line would have come out.  He recommends that the patient follow-up with her doctor or a primary doctor to determine if she is still needs to have the line in place as she has a rather complicated history requiring placement in the first place   Melanie Rank, MD 12/26/21 1639

## 2021-12-31 LAB — CULTURE, BLOOD (ROUTINE X 2)
Culture: NO GROWTH
Culture: NO GROWTH
Special Requests: ADEQUATE
Special Requests: ADEQUATE

## 2022-01-12 ENCOUNTER — Other Ambulatory Visit: Payer: Self-pay

## 2022-01-12 ENCOUNTER — Encounter (HOSPITAL_COMMUNITY): Payer: Self-pay

## 2022-01-12 ENCOUNTER — Emergency Department (HOSPITAL_COMMUNITY)
Admission: EM | Admit: 2022-01-12 | Discharge: 2022-01-12 | Disposition: A | Discharge status: Home / Self Care | Payer: Medicaid Other | Attending: Emergency Medicine | Admitting: Emergency Medicine

## 2022-01-12 ENCOUNTER — Emergency Department (HOSPITAL_COMMUNITY): Payer: Medicaid Other

## 2022-01-12 DIAGNOSIS — F419 Anxiety disorder, unspecified: Secondary | ICD-10-CM | POA: Diagnosis not present

## 2022-01-12 DIAGNOSIS — R509 Fever, unspecified: Secondary | ICD-10-CM | POA: Insufficient documentation

## 2022-01-12 DIAGNOSIS — R000 Tachycardia, unspecified: Secondary | ICD-10-CM | POA: Insufficient documentation

## 2022-01-12 DIAGNOSIS — R112 Nausea with vomiting, unspecified: Secondary | ICD-10-CM | POA: Diagnosis not present

## 2022-01-12 DIAGNOSIS — R5383 Other fatigue: Secondary | ICD-10-CM | POA: Diagnosis not present

## 2022-01-12 DIAGNOSIS — R002 Palpitations: Secondary | ICD-10-CM | POA: Insufficient documentation

## 2022-01-12 DIAGNOSIS — R079 Chest pain, unspecified: Secondary | ICD-10-CM | POA: Insufficient documentation

## 2022-01-12 DIAGNOSIS — R0602 Shortness of breath: Secondary | ICD-10-CM | POA: Insufficient documentation

## 2022-01-12 DIAGNOSIS — M79604 Pain in right leg: Secondary | ICD-10-CM | POA: Diagnosis not present

## 2022-01-12 DIAGNOSIS — M25561 Pain in right knee: Secondary | ICD-10-CM

## 2022-01-12 LAB — COMPREHENSIVE METABOLIC PANEL
ALT: 10 U/L (ref 0–44)
AST: 16 U/L (ref 15–41)
Albumin: 3.2 g/dL — ABNORMAL LOW (ref 3.5–5.0)
Alkaline Phosphatase: 63 U/L (ref 38–126)
Anion gap: 11 (ref 5–15)
BUN: 7 mg/dL (ref 6–20)
CO2: 22 mmol/L (ref 22–32)
Calcium: 9.1 mg/dL (ref 8.9–10.3)
Chloride: 99 mmol/L (ref 98–111)
Creatinine, Ser: 0.75 mg/dL (ref 0.44–1.00)
GFR, Estimated: >60 mL/min (ref >60)
Glucose, Bld: 92 mg/dL (ref 70–99)
Potassium: 3.5 mmol/L (ref 3.5–5.1)
Sodium: 132 mmol/L — ABNORMAL LOW (ref 135–145)
Total Bilirubin: 0.5 mg/dL (ref 0.3–1.2)
Total Protein: 6.8 g/dL (ref 6.5–8.1)

## 2022-01-12 LAB — TROPONIN I (HIGH SENSITIVITY)
Troponin I (High Sensitivity): 3 ng/L (ref <18)
Troponin I (High Sensitivity): 4 ng/L (ref <18)

## 2022-01-12 LAB — CBC WITH DIFFERENTIAL/PLATELET
Abs Immature Granulocytes: 0.05 10*3/uL (ref 0.00–0.07)
Basophils Absolute: 0 10*3/uL (ref 0.0–0.1)
Basophils Relative: 0 %
Eosinophils Absolute: 0 10*3/uL (ref 0.0–0.5)
Eosinophils Relative: 0 %
HCT: 34.3 % — ABNORMAL LOW (ref 36.0–46.0)
Hemoglobin: 10.4 g/dL — ABNORMAL LOW (ref 12.0–15.0)
Immature Granulocytes: 0 %
Lymphocytes Relative: 17 %
Lymphs Abs: 2 10*3/uL (ref 0.7–4.0)
MCH: 23.3 pg — ABNORMAL LOW (ref 26.0–34.0)
MCHC: 30.3 g/dL (ref 30.0–36.0)
MCV: 76.9 fL — ABNORMAL LOW (ref 80.0–100.0)
Monocytes Absolute: 0.7 10*3/uL (ref 0.1–1.0)
Monocytes Relative: 6 %
Neutro Abs: 8.8 10*3/uL — ABNORMAL HIGH (ref 1.7–7.7)
Neutrophils Relative %: 77 %
Platelets: 407 10*3/uL — ABNORMAL HIGH (ref 150–400)
RBC: 4.46 MIL/uL (ref 3.87–5.11)
RDW: 17.9 % — ABNORMAL HIGH (ref 11.5–15.5)
WBC: 11.5 10*3/uL — ABNORMAL HIGH (ref 4.0–10.5)
nRBC: 0 % (ref 0.0–0.2)

## 2022-01-12 LAB — LACTIC ACID, PLASMA: Lactic Acid, Venous: 1 mmol/L (ref 0.5–1.9)

## 2022-01-12 LAB — I-STAT BETA HCG BLOOD, ED (MC, WL, AP ONLY): I-stat hCG, quantitative: <5 m[IU]/mL (ref <5)

## 2022-01-12 LAB — MAGNESIUM: Magnesium: 1.7 mg/dL (ref 1.7–2.4)

## 2022-01-12 MED ORDER — KETOROLAC TROMETHAMINE 15 MG/ML IJ SOLN
15.0000 mg | Freq: Once | INTRAMUSCULAR | Status: AC
  Administered 2022-01-12: 15 mg via INTRAVENOUS
  Filled 2022-01-12: qty 1

## 2022-01-12 MED ORDER — SCOPOLAMINE 1 MG/3DAYS TD PT72
1.0000 | MEDICATED_PATCH | TRANSDERMAL | 0 refills | Status: DC
Start: 2022-01-12 — End: 2023-10-18

## 2022-01-12 MED ORDER — SODIUM CHLORIDE 0.9 % IV BOLUS
1000.0000 mL | Freq: Once | INTRAVENOUS | Status: AC
  Administered 2022-01-12: 1000 mL via INTRAVENOUS

## 2022-01-12 MED ORDER — ONDANSETRON HCL 4 MG/2ML IJ SOLN
4.0000 mg | Freq: Once | INTRAMUSCULAR | Status: AC
  Administered 2022-01-12: 4 mg via INTRAVENOUS
  Filled 2022-01-12: qty 2

## 2022-01-12 MED ORDER — METOCLOPRAMIDE HCL 10 MG PO TABS: 10.0000 mg | ORAL_TABLET | Freq: Four times a day (QID) | ORAL | 0 refills | Status: DC

## 2022-01-12 NOTE — ED Triage Notes (Addendum)
Patient has multiple complaints.  Reports right knee pain and swelling.  Abscess to right posterior thigh that is not healing.  Also reports n/v for the past few days and unable to hold antibiotics down.  Patient also reports when walking around having palpitations and sob.  Patient is suppose to be on fluids and TPN due to medical diagnosis she has.   Patient has a long list of immunodeficiency orders, cortisol disorder.  Patient has PICC to right chest.  Patient is now not homeless and has a lady helping her but medicaid wont approve anything for 90 days.

## 2022-01-12 NOTE — ED Provider Triage Note (Signed)
Emergency Medicine Provider Triage Evaluation Note  Melanie Graves , a 36 y.o. female  was evaluated in triage.  Pt complains of multiple things.  Worried about her potassium.  Reports she was dropped from [facility] so can no longer get her IV fluids and medications such as H2 receptor blockers.  Also says she has had some chest pains and palpitations.  Review of Systems  Positive:  Negative:   Physical Exam  BP 126/85 (BP Location: Left Arm)   Pulse (!) 138   Temp 98.9 F (37.2 C) (Oral)   Resp 20   Ht '5\' 7"'$  (1.702 m)   Wt 63.5 kg   SpO2 100%   BMI 21.93 kg/m  Gen:   Awake, no distress   Resp:  Normal effort  MSK:   Moves extremities without difficulty  Other:  RR, tachy, pale and anxious  Medical Decision Making  Medically screening exam initiated at 3:24 PM.  Appropriate orders placed.  Melanie Graves was informed that the remainder of the evaluation will be completed by another provider, this initial triage assessment does not replace that evaluation, and the importance of remaining in the ED until their evaluation is complete.  Per chart review patient has a history of mast cell activation history, questionable adrenal insufficiency and bacteremia.  We will check a lot of labs due to so many complaints.  Tachycardic-very anxious in triage. Normal vital signs otherwise, no suspicious sirs/sepsis   Melanie Graves A, PA-C 01/12/22 1527

## 2022-01-12 NOTE — ED Notes (Signed)
Patient arrived to the ER with a PICC line. Patient states that she has had the PICC in for "9 months and that it hasn't been working." She claims she is homeless and that she hasn't had any medical attention to the PICC in "months"

## 2022-01-12 NOTE — Discharge Instructions (Addendum)
You have been seen today for your complaint of right knee pain, nausea, vomiting. Your lab work was reassuring and showed no abnormalities. Your imaging was reassuring.  Chest x-ray was negative.  Right knee showed moderate effusion, this can be treated with Ace wrap to begin you today as well as icing the right knee, elevating the knee above the heart level when resting, performing gentle range of motion exercises throughout the day. Your discharge medications include scopolamine patches and Reglan.  These are both nausea medications.  You should take them as prescribed. Home care instructions are as follows:  You should continue to eat and drink a normal diet.  The medications that I sent home with you today should help with this.  You should continue to be persistent about getting you [facility] approved.  You should try to take your medications as prescribed in conjunction with the nausea medications are sent home with you today. Follow up with: Your primary care provider in 1 week Please seek immediate medical care if you develop any of the following symptoms: You cannot move the joint. Your fingers or toes tingle, become numb. or get cold and blue. You have a fever along with a joint that is red, warm, and swollen. At this time there does not appear to be the presence of an emergent medical condition, however there is always the potential for conditions to change. Please read and follow the below instructions.  Do not take your medicine if  develop an itchy rash, swelling in your mouth or lips, or difficulty breathing; call 911 and seek immediate emergency medical attention if this occurs.  You may review your lab tests and imaging results in their entirety on your MyChart account.  Please discuss all results of fully with your primary care provider and other specialist at your follow-up visit.  Note: Portions of this text may have been transcribed using voice recognition  software. Every effort was made to ensure accuracy; however, inadvertent computerized transcription errors may still be present.

## 2022-01-12 NOTE — ED Provider Notes (Signed)
[facility] EMERGENCY DEPARTMENT Provider Note   CSN: [id number] Arrival date & time: 01/12/22  1515     History  Chief Complaint  Patient presents with   Knee Pain   Chest Pain    Melanie Graves is a 36 y.o. female.  With a history of adrenal hypofunction, thyroid disease, immune deficiency disorder, Lyme disease, idiopathic urticaria, palpitations, generalized anxiety disorder who presents to the ED for evaluation of right leg pain, nausea, vomiting, abdominal pain, intermittent chest pain.  Patient was seen on 12/26/2021 for similar complaints.  Patient states that she just recently bought a house to live in, but learned that [place] dropped her and so she cannot afford her medications.  Patient has a central venous hemodialysis catheter in the right chest.  This was placed by Dr. Kathlene Cote in March.  Patient states that she takes much of her medication through this catheter, but has recently run out of flushes and has also had a hard time pushing some of the medications through this.  Patient states she has been unable to take her p.o. medications due to nausea and vomiting.  She reports to me that she has tried p.o. Zofran with mild relief today and was able to eat some chicken noodle soup, but did not try to be taking her medications after this.  Patient states she sometimes attempted to inject her Phenergan intramuscularly when she cannot get her catheter to flush.  Patient's main complaint is increasing right leg pain.  States she is not able to walk on it.  This is new over the past 3 days.  Patient uses a crutch to ambulate at home.  Rates the pain a 10 out of 10.  She denies recent injury.  Patient states that she injured the leg more than 1 month ago.  She was walking with her crutches when she fell onto the right leg and hurting initially.  She then reinjured it approximately 2 weeks ago while walking on.  She states she has been wearing an knee brace on the  right knee for the past month, this has caused a wound on the posterior of the right leg.  She has been treated for an abscess in this area with doxycycline and has completed therapy recently.   Knee Pain Associated symptoms: fatigue and fever   Chest Pain Associated symptoms: abdominal pain, fatigue, fever, nausea, palpitations, shortness of breath and vomiting   Associated symptoms: no cough and no diaphoresis        Home Medications Prior to Admission medications   Medication Sig Start Date End Date Taking? Authorizing Provider  albuterol (VENTOLIN HFA) 108 (90 Base) MCG/ACT inhaler Inhale 1-2 puffs into the lungs every 6 (six) hours as needed for wheezing or shortness of breath.    [provider]  ascorbic acid (VITAMIN C) 1000 MG tablet Take 1,000 mg by mouth daily as needed (immune support).    [provider]  azelastine (ASTELIN) 0.1 % nasal spray Place 2 sprays into both nostrils daily as needed for rhinitis or allergies. 05/18/21   [provider]  CATHFLO ACTIVASE 2 MG injection 2 mg by Intracatheter route daily as needed for open catheter. 03/01/21   [provider]  cephALEXin (KEFLEX) 500 MG capsule Take 1 capsule (500 mg total) by mouth 2 (two) times daily. Qid x 10 days 07/22/21   Shary Key, DO  cephALEXin (KEFLEX) 500 MG capsule Take one capsule by mouth twice daily. 07/22/21  Dameron, Luna Fuse, DO  cetirizine (ZYRTEC) 10 MG tablet Take 10 mg by mouth in the morning and at bedtime. 01/19/19   [provider]  clemastine (TAVIST) 2.68 MG TABS tablet Take 2.68 mg by mouth in the morning and at bedtime.    [provider]  clonazePAM (KLONOPIN) 1 MG tablet Take 1 mg by mouth 4 (four) times daily.    [provider]  cloNIDine (CATAPRES) 0.1 MG tablet Take 0.1 mg by mouth daily as needed (high blood pressure). 04/14/21   [provider]  cromolyn (GASTROCROM) 100 MG/5ML solution Take 200 mg by mouth 4 (four)  times daily.    [provider]  diphenhydrAMINE 50 mg in sodium chloride 0.9 % 50 mL Inject 50 mg into the vein See admin instructions. 3-4 times daily    [provider]  doxycycline (VIBRAMYCIN) 100 MG capsule Take 1 capsule (100 mg total) by mouth 2 (two) times daily. One po bid x 7 days 12/26/21   Mesner, Corene Cornea, MD  EPINEPHrine (ADRENALIN) 1 MG/ML SOLN Inject 0.3 mLs into the muscle as needed for anaphylaxis.    [provider]  EQ ANTI-DIARRHEAL 2 MG tablet Take 2 mg by mouth 4 (four) times daily as needed for diarrhea or loose stools. 04/12/21   [provider]  famotidine (PEPCID) 40 MG tablet Take 40 mg by mouth daily as needed (reflux/to prevent anaphylaxis).    [provider]  fludrocortisone (FLORINEF) 0.1 MG tablet Take 0.1 mg by mouth daily.    [provider]  gabapentin (NEURONTIN) 600 MG tablet Take 600 mg by mouth 3 (three) times daily. 05/12/21   [provider]  HYDROcodone-acetaminophen (NORCO/VICODIN) 5-325 MG tablet Take 1 tablet by mouth every 6 (six) hours as needed. 07/22/21   Shary Key, DO  Hydrocortisone Sod Succinate (SOLU-CORTEF IJ) Inject 100 mg as directed See admin instructions. 2-4 times daily    [provider]  hydrOXYzine (VISTARIL) 25 MG capsule Take 100 mg by mouth in the morning and at bedtime. 04/12/21   [provider]  ibuprofen (ADVIL) 800 MG tablet Take 1 tablet (800 mg total) by mouth every 8 (eight) hours as needed. 09/08/21   Milton Ferguson, MD  ipratropium-albuterol (DUONEB) 0.5-2.5 (3) MG/3ML SOLN Take 3 mLs by nebulization every 4 (four) hours as needed (shortness of breath).    [provider]  ketotifen (ZADITOR) 0.025 % ophthalmic solution Place 1 drop into both eyes in the morning and at bedtime.    [provider]  naloxone [facility]) nasal spray 4 mg/0.1 mL Place 4 mg into the nose daily as needed (opoid overdose). 02/03/21   [provider]  ondansetron (ZOFRAN-ODT) 8 MG disintegrating tablet Take 8 mg by mouth every 8 (eight) hours as needed for nausea or vomiting.    [provider]  QNASL 80 MCG/ACT AERS Place 2 sprays into both nostrils in the morning and at bedtime. 05/18/21   [provider]  Rimegepant Sulfate (NURTEC) 75 MG TBDP Take 75 mg by mouth daily as needed (migraine).    [provider]  rizatriptan (MAXALT) 10 MG tablet Take 10 mg by mouth as needed for migraine. 06/23/16   [provider]  spironolactone (ALDACTONE) 25 MG tablet Take 25 mg by mouth daily. 05/18/21   [provider]  SYMBICORT 160-4.5 MCG/ACT inhaler Inhale 2 puffs into the lungs in the morning and at bedtime. 05/18/21   [provider]  tezepelumab-ekko (TEZSPIRE) 210  MG/1.91ML syringe Inject 210 mg into the skin every 30 (thirty) days.    [provider]  tiZANidine (ZANAFLEX) 4 MG tablet Take 4 mg by mouth in the morning and at bedtime.    [provider]  Vitamin D, Ergocalciferol, (DRISDOL) 1.25 MG (50000 UNIT) CAPS capsule Take 50,000 Units by mouth 2 (two) times a week. No set days    [provider]  Arvid Right 75 MG/0.5ML prefilled syringe Inject 375 mg into the skin every 14 (fourteen) days. 05/25/21   [provider]  zinc gluconate 50 MG tablet Take 50 mg by mouth daily as needed (immune support). 01/04/21   [provider]      Allergies    Barley grass, Beta adrenergic blockers, Chlorhexidine, Fluconazole, Mustard seed, Risperdal [risperidone], and Tomato    Review of Systems   Review of Systems  Constitutional:  Positive for fatigue and fever. Negative for chills and diaphoresis.  Respiratory:  Positive for shortness of breath. Negative for cough and chest tightness.   Cardiovascular:  Positive for chest pain, palpitations and leg swelling.  Gastrointestinal:  Positive for abdominal pain, nausea and vomiting. Negative for blood in stool and  diarrhea.  Genitourinary:  Negative for dysuria.  Musculoskeletal:  Positive for arthralgias, joint swelling and myalgias.  All other systems reviewed and are negative.   Physical Exam Updated Vital Signs BP (!) 136/95   Pulse (!) 110   Temp 99.1 F (37.3 C) (Oral)   Resp (!) 21   Ht '5\' 7"'$  (1.702 m)   Wt 63.5 kg   SpO2 100%   BMI 21.93 kg/m  Physical Exam Vitals and nursing note reviewed.  Constitutional:      General: She is not in acute distress.    Appearance: She is well-developed. She is not ill-appearing.  HENT:     Head: Normocephalic and atraumatic.  Eyes:     Conjunctiva/sclera: Conjunctivae normal.  Cardiovascular:     Rate and Rhythm: Regular rhythm. Tachycardia present.     Pulses:          Radial pulses are 2+ on the right side and 2+ on the left side.       Posterior tibial pulses are 2+ on the right side and 2+ on the left side.     Heart sounds: No murmur heard. Pulmonary:     Effort: Pulmonary effort is normal. No respiratory distress.     Breath sounds: Normal breath sounds. No decreased breath sounds, wheezing, rhonchi or rales.  Chest:     Chest wall: No tenderness.  Abdominal:     Palpations: Abdomen is soft.     Tenderness: There is no abdominal tenderness.  Musculoskeletal:        General: No swelling.     Cervical back: Neck supple.     Right lower leg: Tenderness present. Edema (Right knee) present.     Left lower leg: No tenderness. No edema.     Comments: Right leg ROM decreased secondary to pain  Skin:    General: Skin is warm and dry.     Capillary Refill: Capillary refill takes less than 2 seconds.          Comments: Idiopathic urticaria present over bilateral legs, multiple bruises noted on each leg.  Patient has a 1 cm x 1 cm open wounds on posterior aspect of the right upper leg.  Minimal erythema surrounding this, no drainage.  Patient had a bandage covering this wound initially.  Neurological:  General: No focal deficit  present.     Mental Status: She is alert.  Psychiatric:        Mood and Affect: Mood is anxious.     ED Results / Procedures / Treatments   Labs (all labs ordered are listed, but only abnormal results are displayed) Labs Reviewed  COMPREHENSIVE METABOLIC PANEL - Abnormal; Notable for the following components:      Result Value   Sodium 132 (*)    Albumin 3.2 (*)    All other components within normal limits  CBC WITH DIFFERENTIAL/PLATELET - Abnormal; Notable for the following components:   WBC 11.5 (*)    Hemoglobin 10.4 (*)    HCT 34.3 (*)    MCV 76.9 (*)    MCH 23.3 (*)    RDW 17.9 (*)    Platelets 407 (*)    Neutro Abs 8.8 (*)    All other components within normal limits  MAGNESIUM  LACTIC ACID, PLASMA  LACTIC ACID, PLASMA  I-STAT BETA HCG BLOOD, ED ([facility], [facility], [facility] ONLY)  TROPONIN I (HIGH SENSITIVITY)  TROPONIN I (HIGH SENSITIVITY)    EKG EKG Interpretation  Date/Time:  Tuesday January 12 2022 15:22:53 EDT Ventricular Rate:  127 PR Interval:  114 QRS Duration: 64 QT Interval:  304 QTC Calculation: 441 R Axis:   80 Text Interpretation: Sinus tachycardia Nonspecific T wave abnormality Abnormal ECG When compared with ECG of 18-Jul-2021 17:28, PREVIOUS ECG IS PRESENT Since last tracing rate faster Confirmed by Noemi Chapel [phone]) on 01/12/2022 8:14:10 PM  Radiology DG Chest 2 View  Result Date: 01/12/2022 CLINICAL DATA:  Shortness of breath, chest pain EXAM: CHEST - 2 VIEW COMPARISON:  12/26/2021 FINDINGS: Cardiac size is within normal limits. Lung fields are clear of any infiltrates or pulmonary edema. There is no pleural effusion or pneumothorax. Tip of right IJ central venous catheter is seen in superior vena cava. There is deformity in the posterolateral aspect of left sixth rib consistent with old fracture. IMPRESSION: No active cardiopulmonary disease. Electronically Signed   By: Elmer Picker M.D.   On: 01/12/2022 16:09    Procedures Procedures     Medications Ordered in ED Medications  sodium chloride 0.9 % bolus 1,000 mL (has no administration in time range)  ondansetron (ZOFRAN) injection 4 mg (has no administration in time range)    ED Course/ Medical Decision Making/ A&P Clinical Course as of 01/12/22 2247  Tue Jan 12, 2022  2159 DG Knee Complete 4 Views Right I personally reviewed and interpreted the image.  No bony abnormality, moderate joint effusion. [AS]  2159 DG Chest 2 View I personally reviewed and interpreted the image.  No acute cardiopulmonary abnormality. [AS]  2225 Upon reevaluation, patient is resting comfortably in bed.  States her pain is now tolerable and her nausea is resolved. [AS]    Clinical Course User Index [AS] Claudie Fisherman Grafton Folk, PA-C                           Medical Decision Making Risk Prescription drug management.  This patient presents to the ED for concern of right knee pain, chest pain, nausea, vomiting, this involves an extensive number of treatment options, and is a complaint that carries with it a high risk of complications and morbidity.  The differential diagnosis includes deconditioning, knee contusion, adrenal insufficiency, electrolyte abnormality, malnutrition   Co morbidities that complicate the patient evaluation  adrenal hypofunction, thyroid disease, immune  deficiency disorder, Lyme disease, idiopathic urticaria, palpitations, generalized anxiety disorder  Additional history obtained from: Nursing notes from this visit. Prior ED visit on 12/26/2021 Previous records within EMR system visit on 01/08/2022 with Dr. Marin Roberts for primary care visit  I ordered, reviewed and interpreted labs which include: Troponin, magnesium, lactate, beta-hCG, CBC, CMP.  All labs within normal limits.   I ordered imaging studies including chest x-ray, right knee x-ray I independently visualized and interpreted imaging which showed no acute cardiopulmonary abnormality, moderate right knee  effusion with no osseous abnormality I agree with the radiologist interpretation  Cardiac Monitoring:  The patient was maintained on a cardiac monitor.  I personally viewed and interpreted the cardiac monitored which showed an underlying rhythm of: Sinus tach at an average of 105 bpm  Heart score 1, low risk for MACE.  Afebrile, hemodynamically stable.  Patient had tachycardic rate of 108.  This is improved from 138 after 1 liter normal saline.  Patient has multiple complaints and a large list of pre-existing health conditions.  Patient has been struggling with not having insurance, not having home, not having access to her medications.  This has caused her to stop taking her medications.  Patient has also been unable to eat and drink at home over the past 3 days due to nausea and vomiting.  This is treated in the ED today with IV Zofran and fluids, and I sent her home prescriptions for Reglan and scopolamine patches.  I gave the patient good Rx coupons for these medications so that she may pick them up at a low cost until she is approved for [facility].  Patient is also complaining of right knee pain.  X-ray was positive for moderate effusion, but negative for osseous abnormality.  We will treat this with an Ace wrap, crutches that the patient already has, and RICE therapy at home.  I stressed the importance of following up with her primary care provider for ongoing complaints and that emergencies were ruled out tonight.  Stable at the time of discharge.  At this time there does not appear to be any evidence of an acute emergency medical condition and the patient appears stable for discharge with appropriate outpatient follow up. Diagnosis was discussed with patient who verbalizes understanding of care plan and is agreeable to discharge. I have discussed return precautions with patient who verbalizes understanding. Patient encouraged to follow-up with their PCP within 1 week. All questions  answered.  Patient's case discussed with Dr. Sabra Heck who agrees with plan to discharge with follow-up.   Note: Portions of this report may have been transcribed using voice recognition software. Every effort was made to ensure accuracy; however, inadvertent computerized transcription errors may still be present.          Final Clinical Impression(s) / ED Diagnoses Final diagnoses:  None    Rx / DC Orders ED Discharge Orders     None         Nehemiah Massed 01/12/22 2335    Noemi Chapel, MD 01/13/22 1700

## 2022-01-12 NOTE — ED Notes (Signed)
ED Provider at bedside. 

## 2022-01-12 NOTE — ED Notes (Signed)
DC instructions reviewed with pt. PT verbalized understanding. Pt DC °

## 2022-02-25 ENCOUNTER — Other Ambulatory Visit: Payer: Self-pay

## 2022-02-25 ENCOUNTER — Encounter (HOSPITAL_COMMUNITY): Payer: Self-pay

## 2022-02-25 ENCOUNTER — Emergency Department (HOSPITAL_COMMUNITY): Payer: Medicaid Other

## 2022-02-25 ENCOUNTER — Emergency Department (HOSPITAL_COMMUNITY)
Admission: EM | Admit: 2022-02-25 | Discharge: 2022-02-28 | Disposition: A | Discharge status: Home / Self Care | Payer: Medicaid Other | Attending: Emergency Medicine | Admitting: Emergency Medicine

## 2022-02-25 DIAGNOSIS — F411 Generalized anxiety disorder: Secondary | ICD-10-CM | POA: Insufficient documentation

## 2022-02-25 DIAGNOSIS — Z5900 Homelessness unspecified: Secondary | ICD-10-CM | POA: Insufficient documentation

## 2022-02-25 DIAGNOSIS — F1210 Cannabis abuse, uncomplicated: Secondary | ICD-10-CM | POA: Insufficient documentation

## 2022-02-25 DIAGNOSIS — F329 Major depressive disorder, single episode, unspecified: Secondary | ICD-10-CM

## 2022-02-25 DIAGNOSIS — F339 Major depressive disorder, recurrent, unspecified: Secondary | ICD-10-CM | POA: Insufficient documentation

## 2022-02-25 DIAGNOSIS — Y900 Blood alcohol level of less than 20 mg/100 ml: Secondary | ICD-10-CM | POA: Insufficient documentation

## 2022-02-25 DIAGNOSIS — R45851 Suicidal ideations: Secondary | ICD-10-CM | POA: Diagnosis not present

## 2022-02-25 DIAGNOSIS — F22 Delusional disorders: Secondary | ICD-10-CM | POA: Diagnosis not present

## 2022-02-25 DIAGNOSIS — Z7951 Long term (current) use of inhaled steroids: Secondary | ICD-10-CM | POA: Insufficient documentation

## 2022-02-25 DIAGNOSIS — N3000 Acute cystitis without hematuria: Secondary | ICD-10-CM

## 2022-02-25 DIAGNOSIS — J45909 Unspecified asthma, uncomplicated: Secondary | ICD-10-CM | POA: Diagnosis not present

## 2022-02-25 DIAGNOSIS — Z046 Encounter for general psychiatric examination, requested by authority: Secondary | ICD-10-CM | POA: Diagnosis present

## 2022-02-25 DIAGNOSIS — R000 Tachycardia, unspecified: Secondary | ICD-10-CM | POA: Insufficient documentation

## 2022-02-25 HISTORY — DX: Elevated white blood cell count, unspecified: D72.829

## 2022-02-25 LAB — CBC
HCT: 42.1 % (ref 36.0–46.0)
Hemoglobin: 12.4 g/dL (ref 12.0–15.0)
MCH: 23.4 pg — ABNORMAL LOW (ref 26.0–34.0)
MCHC: 29.5 g/dL — ABNORMAL LOW (ref 30.0–36.0)
MCV: 79.3 fL — ABNORMAL LOW (ref 80.0–100.0)
Platelets: 328 10*3/uL (ref 150–400)
RBC: 5.31 MIL/uL — ABNORMAL HIGH (ref 3.87–5.11)
RDW: 17.6 % — ABNORMAL HIGH (ref 11.5–15.5)
WBC: 9.9 10*3/uL (ref 4.0–10.5)
nRBC: 0 % (ref 0.0–0.2)

## 2022-02-25 LAB — COMPREHENSIVE METABOLIC PANEL
ALT: 14 U/L (ref 0–44)
AST: 17 U/L (ref 15–41)
Albumin: 3.9 g/dL (ref 3.5–5.0)
Alkaline Phosphatase: 84 U/L (ref 38–126)
Anion gap: 8 (ref 5–15)
BUN: 9 mg/dL (ref 6–20)
CO2: 27 mmol/L (ref 22–32)
Calcium: 9.4 mg/dL (ref 8.9–10.3)
Chloride: 102 mmol/L (ref 98–111)
Creatinine, Ser: 0.64 mg/dL (ref 0.44–1.00)
GFR, Estimated: >60 mL/min (ref >60)
Glucose, Bld: 96 mg/dL (ref 70–99)
Potassium: 3.6 mmol/L (ref 3.5–5.1)
Sodium: 137 mmol/L (ref 135–145)
Total Bilirubin: 0.5 mg/dL (ref 0.3–1.2)
Total Protein: 7.7 g/dL (ref 6.5–8.1)

## 2022-02-25 LAB — ACETAMINOPHEN LEVEL: Acetaminophen (Tylenol), Serum: <10 ug/mL — ABNORMAL LOW (ref 10–30)

## 2022-02-25 LAB — ETHANOL: Alcohol, Ethyl (B): <10 mg/dL (ref <10)

## 2022-02-25 LAB — SALICYLATE LEVEL: Salicylate Lvl: <7 mg/dL — ABNORMAL LOW (ref 7.0–30.0)

## 2022-02-25 NOTE — ED Notes (Addendum)
After talking with pt. Pt lives in a homeless shelter and states that she used to "be more successful". States she is here for mental instability and does not take her medications due to lack of insurance. Pt denies SI/HI at this time. Pt came in with an an already accessed CVC double lumen in place. Pt admits to smoking marijuana for therapeutic reasons.
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
## 2022-02-25 NOTE — ED Provider Notes (Signed)
[facility] EMERGENCY DEPARTMENT Provider Note   CSN: [id number] Arrival date & time: 02/25/22  2013     History  No chief complaint on file.   Melanie Graves is a 36 y.o. female.  HPI Patient presents with reported both mental and physical evaluations.  States that she has not been taking her medicines.  Has not been seeing Dr.  Marisa Sprinkles now.  Has various complaints of just pain all over.  States she is tired.  On physical exam has a right chest wall catheter.  States she has been on TPN and get other medicines through that for sepsis.  Patient really not providing a lot of history.   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 Authorizing Provider  cetirizine (ZYRTEC) 10 MG tablet Take 10 mg by mouth in the morning and at bedtime. 01/19/19  Yes [provider]  clemastine (TAVIST) 2.68 MG TABS tablet Take 2.68 mg by mouth daily as needed (allergies).   Yes [provider]  cromolyn (GASTROCROM) 100 MG/5ML solution Take 200 mg by mouth 4 (four) times daily.   Yes [provider]  EPINEPHrine (ADRENALIN) 1 MG/ML SOLN Inject 0.3 mLs into the muscle as needed for anaphylaxis.   Yes [provider]  famotidine (PEPCID) 40 MG tablet Take 40 mg by mouth daily as needed (reflux/to prevent anaphylaxis).   Yes [provider]  ondansetron (ZOFRAN-ODT) 8 MG disintegrating tablet Take 8 mg by mouth every 8 (eight) hours as needed for nausea or vomiting.   Yes [provider]  Probiotic Product (PROBIOTIC PO) Take 1 tablet by mouth daily.   Yes [provider]  Arvid Right 75 MG/0.5ML prefilled syringe Inject 375 mg into the skin every 14 (fourteen) days. 05/25/21  Yes [provider]  albuterol (VENTOLIN HFA) 108 (90 Base) MCG/ACT inhaler Inhale 1-2 puffs into the lungs every 6 (six) hours as needed for wheezing or shortness of breath. Patient not taking: Reported on 02/26/2022    [provider]  CATHFLO ACTIVASE 2 MG injection 2 mg by Intracatheter route daily as needed for open catheter. 03/01/21   [provider]  clonazePAM (KLONOPIN) 1 MG tablet Take 1 mg by mouth 4 (four) times daily. Patient not taking: Reported on 02/26/2022    [provider]  cloNIDine (CATAPRES) 0.1 MG tablet Take 0.1 mg by mouth daily as needed (high blood pressure). Patient not taking: Reported on 02/26/2022 04/14/21   [provider]  fludrocortisone (FLORINEF) 0.1 MG tablet Take 0.1 mg by mouth daily. Patient not taking: Reported on 02/26/2022    [provider]  gabapentin (NEURONTIN) 600 MG tablet Take 600 mg by mouth 3 (three) times daily. Patient not taking: Reported on 02/26/2022 05/12/21   [provider]  hydrOXYzine (VISTARIL) 25 MG capsule Take 100 mg by mouth in the morning and at bedtime. Patient not taking: Reported on 02/26/2022 04/12/21   [provider]  metoCLOPramide (REGLAN) 10 MG tablet Take 1 tablet (10 mg total) by mouth every 6 (six) hours.  Patient not taking: Reported on 02/26/2022 01/12/22   Roylene Reason, PA-C  scopolamine (TRANSDERM-SCOP) 1 MG/3DAYS Place 1 patch (1.5 mg total) onto the skin every 3 (three) days. Patient not taking: Reported on 02/26/2022 01/12/22   Roylene Reason, PA-C  spironolactone (ALDACTONE) 25 MG tablet Take 25 mg by mouth daily. Patient not taking: Reported on 02/26/2022 05/18/21   [provider]  SYMBICORT 160-4.5 MCG/ACT inhaler Inhale 2 puffs into the lungs in the morning and at bedtime. Patient not taking: Reported on  02/26/2022 05/18/21   [provider]  tezepelumab-ekko (TEZSPIRE) 210 MG/1.91ML syringe Inject 210 mg into the skin every 30 (thirty) days. Patient not taking: Reported on 02/26/2022    [provider]  tiZANidine (ZANAFLEX) 4 MG tablet Take 4 mg by mouth in the morning and at bedtime. Patient not taking: Reported on 02/26/2022    [provider]  Vitamin D, Ergocalciferol, (DRISDOL) 1.25 MG (50000 UNIT) CAPS capsule Take 50,000 Units by mouth 2 (two) times a week. No set days Patient not taking: Reported on 02/26/2022    [provider]      Allergies    Cinnamon, Barley grass, Beta adrenergic blockers, Celery oil, Chlorhexidine, Fluconazole, Lactose intolerance (gi), Mustard seed, Risperdal [risperidone], and Tomato    Review of Systems   Review of Systems  Physical Exam Updated Vital Signs BP 112/85 (BP Location: Right Arm)   Pulse (!) 102   Temp 97.9 F (36.6 C) (Oral)   Resp 15   LMP 02/18/2022   SpO2 99%  Physical Exam Vitals and nursing note reviewed.  HENT:     Head: Atraumatic.  Eyes:     Pupils: Pupils are equal, round, and reactive to light.  Cardiovascular:     Rate and Rhythm: Regular rhythm.  Pulmonary:     Comments: Catheter right chest wall Abdominal:     Tenderness: There is no abdominal tenderness.  Musculoskeletal:        General: Tenderness present.  Skin:    General: Skin is warm.  Neurological:     Mental Status: She is alert and oriented to person, place, and time.  Psychiatric:     Comments: Somewhat flat affect.     ED Results / Procedures / Treatments   Labs (all labs ordered are listed, but only abnormal results are displayed) Labs Reviewed  SALICYLATE LEVEL - Abnormal; Notable for the following components:      Result Value   Salicylate Lvl <4.0 (*)    All other components within normal limits  ACETAMINOPHEN LEVEL - Abnormal; Notable for the following components:   Acetaminophen (Tylenol), Serum <10  (*)    All other components within normal limits  CBC - Abnormal; Notable for the following components:   RBC 5.31 (*)    MCV 79.3 (*)    MCH 23.4 (*)    MCHC 29.5 (*)    RDW 17.6 (*)    All other components within normal limits  RAPID URINE DRUG SCREEN, HOSP PERFORMED - Abnormal; Notable for the following components:   Tetrahydrocannabinol POSITIVE (*)    All other components within normal limits  URINALYSIS, ROUTINE W REFLEX MICROSCOPIC - Abnormal; Notable for the following components:   APPearance CLOUDY (*)    Protein, ur 30 (*)    Nitrite POSITIVE (*)    Leukocytes,Ua SMALL (*)    Bacteria, UA MANY (*)    All other components within normal limits  URINE CULTURE  COMPREHENSIVE METABOLIC PANEL  ETHANOL  POC URINE PREG, ED    EKG None  Radiology DG Chest 2 View  Result Date: 02/25/2022 CLINICAL DATA:  Weakness EXAM: CHEST - 2 VIEW COMPARISON:  01/12/2022 FINDINGS: Right-sided central venous catheter tip over the SVC. No acute airspace disease or effusion. Normal cardiac size. No pneumothorax IMPRESSION: No active cardiopulmonary disease. Electronically Signed   By: Donavan Foil M.D.   On: 02/25/2022 22:20    Procedures Procedures    Medications Ordered in ED Medications  OLANZapine zydis (ZYPREXA) disintegrating tablet 5 mg (5 mg Oral Given 02/26/22 1215)  hydrOXYzine (ATARAX) tablet 100 mg (100 mg Oral Given 02/26/22 1216)  fosfomycin (MONUROL) packet 3 g (3 g Oral Given 02/26/22 0413)  ondansetron (ZOFRAN-ODT) disintegrating tablet 8 mg (8 mg Oral Given 02/26/22 1829)    ED Course/ Medical Decision Making/ A&P                           Medical Decision Making Amount and/or Complexity of Data Reviewed Labs: ordered. Radiology: ordered.   Patient presents for both medical and psychologic issues.  Has been noncompliant with her medical regimen.  Had been on TPN previously.  Has different autoimmune diseases.  Also homeless now.  Has been off her medicines.   Also chronic pain. On the more mental side states she may have a diagnosis of anxiety and PTSD.  Unsure if she has been treated for in the past.  States she is not on any medicines for it.  It is unclear whether she is supposed to be.  Has children's and really does not feel suicidal but does not want to be here anymore and is tired.  We will get basic blood work and evaluate.  Has catheter on right chest wall without erythema.  Mild tachycardia.  Care turned over to oncoming provider.        Final Clinical Impression(s) / ED Diagnoses Final diagnoses:  None    Rx / DC Orders ED Discharge Orders     None         Davonna Belling, MD 02/26/22 1443

## 2022-02-26 ENCOUNTER — Encounter (HOSPITAL_COMMUNITY): Payer: Self-pay | Admitting: Emergency Medicine

## 2022-02-26 DIAGNOSIS — F339 Major depressive disorder, recurrent, unspecified: Secondary | ICD-10-CM | POA: Diagnosis present

## 2022-02-26 LAB — RAPID URINE DRUG SCREEN, HOSP PERFORMED
Amphetamines: NOT DETECTED
Barbiturates: NOT DETECTED
Benzodiazepines: NOT DETECTED
Cocaine: NOT DETECTED
Opiates: NOT DETECTED
Tetrahydrocannabinol: POSITIVE — AB

## 2022-02-26 LAB — URINALYSIS, ROUTINE W REFLEX MICROSCOPIC
Bilirubin Urine: NEGATIVE
Glucose, UA: NEGATIVE mg/dL
Hgb urine dipstick: NEGATIVE
Ketones, ur: NEGATIVE mg/dL
Nitrite: POSITIVE — AB
Protein, ur: 30 mg/dL — AB
Specific Gravity, Urine: 1.027 (ref 1.005–1.030)
pH: 5 (ref 5.0–8.0)

## 2022-02-26 LAB — POC URINE PREG, ED: Preg Test, Ur: NEGATIVE

## 2022-02-26 MED ORDER — HYDROXYZINE HCL 25 MG PO TABS
100.0000 mg | ORAL_TABLET | Freq: Three times a day (TID) | ORAL | Status: DC | PRN
Start: 2022-02-26 — End: 2022-02-28
  Administered 2022-02-26 – 2022-02-27 (×3): 100 mg via ORAL
  Filled 2022-02-26 (×3): qty 4

## 2022-02-26 MED ORDER — ONDANSETRON 8 MG PO TBDP
8.0000 mg | ORAL_TABLET | Freq: Once | ORAL | Status: AC
  Administered 2022-02-26: 8 mg via ORAL
  Filled 2022-02-26: qty 1

## 2022-02-26 MED ORDER — FOSFOMYCIN TROMETHAMINE 3 G PO PACK
3.0000 g | PACK | Freq: Once | ORAL | Status: AC
  Administered 2022-02-26: 3 g via ORAL
  Filled 2022-02-26: qty 3

## 2022-02-26 MED ORDER — OLANZAPINE 5 MG PO TBDP
5.0000 mg | ORAL_TABLET | Freq: Every day | ORAL | Status: DC
  Administered 2022-02-26 – 2022-02-27 (×2): 5 mg via ORAL
  Filled 2022-02-26 (×2): qty 1

## 2022-02-26 NOTE — Progress Notes (Signed)
Pt meets inpatient criteria per Inda Merlin, NP. Pt was deined at [facility] due to PICC line.   CSW has sent referral to out of network providers:   Destination Service Provider Address Phone Fax  [place] [place] [address] [phone]  [facility]  [place], [place] [address] [place]  [facility]  [address] [place] [address] [phone]  [place]  [place], [place] [address] [phone]  [facility]  [place] [place] [address] [place]  [facility]  [address] [place] [address] [phone]  [facility]  [address], [place] [address] [phone]  [place] [place] [address] [phone]  [place], [place] [address] [phone] [facility]  [place] [address] [phone] [facility]  [address] [place] [address] [phone]  [facility]  [place] [place] [address] [phone] [facility]  [address], [place] [address] [place]  [facility]  [address] [place] [address] [place]  [facility]  [address], [place] [address] [phone]  [place]  [place] [place] [address] [place]  [facility]  [address] [place] [address] [phone]   [facility]  [address] [place] [address] [phone] [place], MSW, [place] 02/26/2022 5:37 PM

## 2022-02-26 NOTE — ED Notes (Signed)
Pt getting TTS 
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
## 2022-02-26 NOTE — ED Notes (Signed)
Pt given [place] sandwich bag with gingerale

## 2022-02-26 NOTE — ED Notes (Addendum)
TTS consultant reported pt's use of THC vape pen during interview. RN removed THC pen from the room and placed in a biohazard bag with pt label, to be returned to pt upon discharge. Charge RN aware. RN handed vape in bag to security officer who states he will put it in the security safe until pt is discharged or transferred.

## 2022-02-26 NOTE — Consult Note (Signed)
Telepsych Consultation   Reason for Consult:  depression Referring Physician:  Orpah Greek, MD Location of Patient:  [facility] Location of Provider: [place] Department  Patient Identification: Melanie Graves MRN:  [id number] Principal Diagnosis: MDD (major depressive disorder), recurrent episode, with atypical features ([place]) Diagnosis:  Principal Problem:   MDD (major depressive disorder), recurrent episode, with atypical features ([place])   Total Time spent with patient: 30 minutes  Subjective:   Melanie Graves is a 36 y.o. female patient admitted with depression.  Patient presents alert and oriented. Calm and cooperative. "Trapped in my head. I apparently have been dodging a lot of things for a long time and I can't take anymore of being stuck in head."  Reports currently living in "[place]" then states living in homeless shelter in [place]. Originally from [place]; has 3 children whom she doesn't have custody of. Denies any thoughts of wanting to harm herself. Tearful; states she needs her kids back (currently with their fathers) but has to get herself stable mentally. She is circumstantial and tangential with possible delusions (grandiose). Loose, scattered associations. She is jumping from subject to subject with visible changes in mood. She endorses smoking marijuana including a vape pen she shows provider; states she has not been taking any of her medications due to issues with insurance and access. Provider discussed the importance to refrain from marijuana use and possible effects on mental health as well as starting psychiatric medications. She is agreeable to treatment at this time.   [facility] [employer] [phone] Report made and accepted.   HPI:  Melanie Graves is a 36 year old female with past psychiatric history of anxiety who presented to [facility] seeking mental health evaluation. Patient has past medical history of  autoimmune disease, adrenal insufficiency, chronic hypokalemia; currently has active PICC line. Medical history noted in [place]; no history of psychiatric hospitalizations noted. She reports majority of family has died out, brother is currently suffering from substance abuse. No outpatient supports noted. UDS+THC, BAL<10.  PDMP reviewed, no active prescriptions noted.   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[place])    solar urticaria    Thyroid disease     Past Surgical History:  Procedure Laterality Date   ABDOMINAL SURGERY     ballon sinus plasty     IR FLUORO GUIDE CV LINE RIGHT  06/08/2021   IR FLUORO GUIDE CV LINE RIGHT  07/21/2021   IR US GUIDE VASC ACCESS RIGHT  07/21/2021   Family History:  Family History  Problem Relation Age of Onset   Renal cancer Mother    Osteoporosis Mother    Lumbar disc disease Mother    Skin cancer Father    Drug abuse Sister    Hepatitis C Brother    Heart disease Brother    Asthma Brother    Family Psychiatric  History: not noted Social History:  Social History   Substance and Sexual Activity  Alcohol Use Never     Social History  Substance and Sexual Activity  Drug Use Yes   Types: Marijuana    Social History   Socioeconomic History   Marital status: Single    Spouse name: Not on file   Number of children: Not on file   Years of education: Not on file   Highest education level: Not on file  Occupational History   Not on file  Tobacco Use   Smoking status: Never   Smokeless tobacco: Never  Vaping Use   Vaping Use:  Some days   Substances: Nicotine  Substance and Sexual Activity   Alcohol use: Never   Drug use: Yes    Types: Marijuana   Sexual activity: Yes  Other Topics Concern   Not on file  Social History Narrative   Not on file   Social Determinants of Health   Financial Resource Strain: Not on file  Food Insecurity: Not on file  Transportation Needs: Not on file  Physical Activity: Not on file  Stress: Not on file  Social Connections: Not on file   Additional Social History:   Allergies:   Allergies  Allergen Reactions   Cinnamon Anaphylaxis   Barley Grass Hives   Beta Adrenergic Blockers Other (See Comments)    Patient states it is contraindicated for her.    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" - can take it but has to take zyrtec and zantac prior    Lactose Intolerance (Gi)    Mustard Seed Hives and Itching   Risperdal [Risperidone]     Adverse reactions   Tomato Hives    Labs:  Results for orders placed or performed during the hospital encounter of 02/25/22 (from the past 48 hour(s))  Comprehensive metabolic panel     Status: None   Collection Time: 02/25/22  9:38 PM  Result Value Ref Range   Sodium 137 135 - 145 mmol/L   Potassium 3.6 3.5 - 5.1 mmol/L   Chloride 102 98 - 111 mmol/L   CO2 27 22 - 32 mmol/L   Glucose, Bld 96 70 - 99 mg/dL    Comment: Glucose reference range applies only to samples taken after fasting for at least 8 hours.   BUN 9 6 - 20 mg/dL   Creatinine, Ser 0.64 0.44 - 1.00 mg/dL   Calcium 9.4 8.9 - 10.3 mg/dL   Total Protein 7.7 6.5 - 8.1 g/dL   Albumin 3.9 3.5 - 5.0 g/dL   AST 17 15 - 41 U/L   ALT 14 0 - 44 U/L   Alkaline Phosphatase 84 38 - 126 U/L   Total Bilirubin 0.5 0.3 - 1.2 mg/dL   GFR, Estimated >60 >60 mL/min    Comment: (NOTE) Calculated using the CKD-EPI Creatinine Equation (2021)    Anion gap 8 5 - 15    Comment:  Performed at [facility], [address], [place], [place] [address]  Ethanol     Status: None   Collection Time: 02/25/22  9:38 PM  Result Value Ref Range   Alcohol, Ethyl (B) <10 <10 mg/dL    Comment: (NOTE) Lowest detectable limit for serum alcohol is 10 mg/dL.  For medical purposes only. Performed at [facility], [address], [place], [place] [address]   Salicylate level     Status: Abnormal   Collection Time: 02/25/22  9:38 PM  Result Value Ref Range   Salicylate Lvl <4.7 (L)  7.0 - 30.0 mg/dL    Comment: Performed at [facility], [address] [place], [place] [address]  Acetaminophen level     Status: Abnormal   Collection Time: 02/25/22  9:38 PM  Result Value Ref Range   Acetaminophen (Tylenol), Serum <10 (L) 10 - 30 ug/mL    Comment: (NOTE) Therapeutic concentrations vary significantly. A range of 10-30 ug/mL  may be an effective concentration for many patients. However, some  are best treated at concentrations outside of this range. Acetaminophen concentrations >150 ug/mL at 4 hours after ingestion  and >50 ug/mL at 12 hours after ingestion are often associated with  toxic reactions.  Performed at [facility], [address], [place], [place] [address]   cbc     Status: Abnormal   Collection Time: 02/25/22  9:38 PM  Result Value Ref Range   WBC 9.9 4.0 - 10.5 K/uL   RBC 5.31 (H) 3.87 - 5.11 MIL/uL   Hemoglobin 12.4 12.0 - 15.0 g/dL   HCT 42.1 36.0 - 46.0 %   MCV 79.3 (L) 80.0 - 100.0 fL   MCH 23.4 (L) 26.0 - 34.0 pg   MCHC 29.5 (L) 30.0 - 36.0 g/dL   RDW 17.6 (H) 11.5 - 15.5 %   Platelets 328 150 - 400 K/uL   nRBC 0.0 0.0 - 0.2 %    Comment: Performed at [facility], [address] [place], [place] [address]  Rapid urine drug screen (hospital performed)     Status: Abnormal   Collection Time: 02/26/22 12:26 AM  Result Value Ref Range   Opiates NONE DETECTED NONE DETECTED   Cocaine NONE DETECTED NONE DETECTED   Benzodiazepines NONE DETECTED NONE DETECTED    Amphetamines NONE DETECTED NONE DETECTED   Tetrahydrocannabinol POSITIVE (A) NONE DETECTED   Barbiturates NONE DETECTED NONE DETECTED    Comment: (NOTE) DRUG SCREEN FOR MEDICAL PURPOSES ONLY.  IF CONFIRMATION IS NEEDED FOR ANY PURPOSE, NOTIFY LAB WITHIN 5 DAYS.  LOWEST DETECTABLE LIMITS FOR URINE DRUG SCREEN Drug Class                     Cutoff (ng/mL) Amphetamine and metabolites    1000 Barbiturate and metabolites    200 Benzodiazepine                 200 Opiates and metabolites        300 Cocaine and metabolites        300 THC                            50 Performed at [facility], [address], [place], [place] [address]   Urinalysis, Routine w reflex microscopic Urine, Clean Catch     Status: Abnormal   Collection Time: 02/26/22 12:26 AM  Result Value Ref Range   Color, Urine YELLOW YELLOW   APPearance CLOUDY (A) CLEAR   Specific Gravity, Urine 1.027 1.005 - 1.030   pH 5.0 5.0 - 8.0   Glucose, UA NEGATIVE NEGATIVE mg/dL   Hgb urine dipstick NEGATIVE NEGATIVE   Bilirubin Urine NEGATIVE NEGATIVE   Ketones, ur NEGATIVE NEGATIVE mg/dL   Protein, ur 30 (A) NEGATIVE mg/dL   Nitrite POSITIVE (A) NEGATIVE   Leukocytes,Ua SMALL (A) NEGATIVE   RBC / HPF 6-10 0 - 5 RBC/hpf   WBC, UA 21-50 0 - 5 WBC/hpf   Bacteria, UA MANY (A) NONE SEEN   Squamous Epithelial / LPF  21-50 0 - 5   Mucus PRESENT    Ca Oxalate Crys, UA PRESENT     Comment: Performed at [facility], [address], [place], [place] [address]  POC urine preg, ED     Status: None   Collection Time: 02/26/22 12:29 AM  Result Value Ref Range   Preg Test, Ur NEGATIVE NEGATIVE    Comment:        THE SENSITIVITY OF THIS METHODOLOGY IS >24 mIU/mL     Medications:  Current Facility-Administered Medications  Medication Dose Route Frequency Provider Last Rate Last Admin   hydrOXYzine (ATARAX) tablet 100 mg  100 mg Oral TID PRN Leevy-Johnson, Mollee Neer A, NP   100 mg at 02/26/22 1216   OLANZapine zydis (ZYPREXA)  disintegrating tablet 5 mg  5 mg Oral Daily Leevy-Johnson, Dylan Ruotolo A, NP   5 mg at 02/26/22 1215   Current Outpatient Medications  Medication Sig Dispense Refill   cetirizine (ZYRTEC) 10 MG tablet Take 10 mg by mouth in the morning and at bedtime.     clemastine (TAVIST) 2.68 MG TABS tablet Take 2.68 mg by mouth daily as needed (allergies).     cromolyn (GASTROCROM) 100 MG/5ML solution Take 200 mg by mouth 4 (four) times daily.     EPINEPHrine (ADRENALIN) 1 MG/ML SOLN Inject 0.3 mLs into the muscle as needed for anaphylaxis.     famotidine (PEPCID) 40 MG tablet Take 40 mg by mouth daily as needed (reflux/to prevent anaphylaxis).     ondansetron (ZOFRAN-ODT) 8 MG disintegrating tablet Take 8 mg by mouth every 8 (eight) hours as needed for nausea or vomiting.     Probiotic Product (PROBIOTIC PO) Take 1 tablet by mouth daily.     XOLAIR 75 MG/0.5ML prefilled syringe Inject 375 mg into the skin every 14 (fourteen) days.     albuterol (VENTOLIN HFA) 108 (90 Base) MCG/ACT inhaler Inhale 1-2 puffs into the lungs every 6 (six) hours as needed for wheezing or shortness of breath. (Patient not taking: Reported on 02/26/2022)     CATHFLO ACTIVASE 2 MG injection 2 mg by Intracatheter route daily as needed for open catheter.     clonazePAM (KLONOPIN) 1 MG tablet Take 1 mg by mouth 4 (four) times daily. (Patient not taking: Reported on 02/26/2022)     cloNIDine (CATAPRES) 0.1 MG tablet Take 0.1 mg by mouth daily as needed (high blood pressure). (Patient not taking: Reported on 02/26/2022)     fludrocortisone (FLORINEF) 0.1 MG tablet Take 0.1 mg by mouth daily. (Patient not taking: Reported on 02/26/2022)     gabapentin (NEURONTIN) 600 MG tablet Take 600 mg by mouth 3 (three) times daily. (Patient not taking: Reported on 02/26/2022)     hydrOXYzine (VISTARIL) 25 MG capsule Take 100 mg by mouth in the morning and at bedtime. (Patient not taking: Reported on 02/26/2022)     metoCLOPramide (REGLAN) 10 MG tablet Take  1 tablet (10 mg total) by mouth every 6 (six) hours. (Patient not taking: Reported on 02/26/2022) 30 tablet 0   scopolamine (TRANSDERM-SCOP) 1 MG/3DAYS Place 1 patch (1.5 mg total) onto the skin every 3 (three) days. (Patient not taking: Reported on 02/26/2022) 10 patch 0   spironolactone (ALDACTONE) 25 MG tablet Take 25 mg by mouth daily. (Patient not taking: Reported on 02/26/2022)     SYMBICORT 160-4.5 MCG/ACT inhaler Inhale 2 puffs into the lungs in the morning and at bedtime. (Patient not taking: Reported on 02/26/2022)     tezepelumab-ekko (TEZSPIRE) 210  MG/1.91ML syringe Inject 210 mg into the skin every 30 (thirty) days. (Patient not taking: Reported on 02/26/2022)     tiZANidine (ZANAFLEX) 4 MG tablet Take 4 mg by mouth in the morning and at bedtime. (Patient not taking: Reported on 02/26/2022)     Vitamin D, Ergocalciferol, (DRISDOL) 1.25 MG (50000 UNIT) CAPS capsule Take 50,000 Units by mouth 2 (two) times a week. No set days (Patient not taking: Reported on 02/26/2022)     Musculoskeletal: Strength & Muscle Tone: within normal limits Gait & Station: normal Patient leans: N/A  Psychiatric Specialty Exam:  Presentation  General Appearance:  Casual  Eye Contact: Fair  Speech: Clear and Coherent  Speech Volume: Normal  Handedness:Right  Mood and Affect  Mood: Labile; Hopeless  Affect: Non-Congruent; Tearful; Labile  Thought Process  Thought Processes: Disorganized  Descriptions of Associations:Tangential  Orientation:Partial  Thought Content:Perseveration; Scattered; Tangential  History of Schizophrenia/Schizoaffective disorder:No data recorded Duration of Psychotic Symptoms:No data recorded Hallucinations:Hallucinations: None  Ideas of Reference:None  Suicidal Thoughts:Suicidal Thoughts: Yes, Passive  Homicidal Thoughts:Homicidal Thoughts: No  Sensorium  Memory: Immediate Fair; Recent Fair  Judgment: Impaired  Insight: Shallow  Executive  Functions  Concentration: Poor  Attention Span: Poor  Recall:Poor  Fund of Knowledge: Fair  Language: Fair  Psychomotor Activity  Psychomotor Activity:Psychomotor Activity: Normal  Assets  Assets: Resilience  Sleep  Sleep:Sleep: Fair  Physical Exam: Physical Exam Vitals and nursing note reviewed.  Constitutional:      Appearance: She is ill-appearing.  HENT:     Head: Normocephalic.     Nose: Nose normal.     Mouth/Throat:     Pharynx: Oropharynx is clear.  Eyes:     Pupils: Pupils are equal, round, and reactive to light.  Cardiovascular:     Rate and Rhythm: Normal rate.     Pulses: Normal pulses.  Pulmonary:     Effort: Pulmonary effort is normal.  Abdominal:     Palpations: Abdomen is soft.  Musculoskeletal:        General: Normal range of motion.     Cervical back: Normal range of motion.  Skin:    General: Skin is dry.  Neurological:     Mental Status: She is oriented to person, place, and time.  Psychiatric:        Attention and Perception: She is inattentive. She does not perceive auditory or visual hallucinations.        Mood and Affect: Mood is anxious. Affect is labile.        Speech: Speech is rapid and pressured.        Behavior: Behavior is cooperative.        Thought Content: Thought content is paranoid and delusional. Thought content includes suicidal ideation. Thought content does not include suicidal plan.        Judgment: Judgment is impulsive and inappropriate.    Review of Systems  Psychiatric/Behavioral:  Positive for depression and suicidal ideas.    Blood pressure 112/85, pulse (!) 102, temperature 97.9 F (36.6 C), temperature source Oral, resp. rate 15, last menstrual period 02/18/2022, SpO2 99 %. There is no height or weight on file to calculate BMI.  Treatment Plan Summary: Daily contact with patient to assess and evaluate symptoms and progress in treatment, Medication management, and Plan begin medications to address noted  symptoms and seek inpatient psychiatric hospitalization  Disposition: Recommend psychiatric Inpatient admission when medically cleared. Supportive therapy provided about ongoing stressors. Discussed crisis plan, support from social network, calling  911, coming to the Emergency Department, and calling Suicide Hotline.  This service was provided via telemedicine using a 2-way, interactive audio and video technology.  Names of all persons participating in this telemedicine service and their role in this encounter. Name: Oneida Alar Role: PMHNP  Name: Hampton Abbot Role: Attending MD  Name: Melanie Graves Role: patient  Name:  Role:     Inda Merlin, NP 02/26/2022 1:54 PM

## 2022-02-26 NOTE — ED Notes (Signed)
Notified provider that both lumens of pt's subclavian line are completely occluded and will not flush or provide blood return. Awaiting orders/instruction regarding continuation or removal of line.

## 2022-02-26 NOTE — ED Notes (Signed)
ED Provider at bedside. 

## 2022-02-26 NOTE — ED Notes (Signed)
Security wanded patient at this. Patient changed into hospital scrubs and personal belongings removed including books, cell phone, clothing.

## 2022-02-26 NOTE — ED Notes (Signed)
Pt requested bedside commode in room. NT to put in pt's room

## 2022-02-26 NOTE — Progress Notes (Signed)
Patient meets [facility] inpatient criteria per Oneida Alar, FNP. Patient has been faxed out to the following facilities:    [place] [place] [address] [phone] [facility]  [place], [place] [address] [phone]  [facility]  [address] [place] [address] [phone]  [place]  [place], [place] [address] [phone]  [facility]  [address] [place] [address] [place]  [facility]  [address] [place] [address] [phone]  [facility]  [address] [place] [address] [phone]  [facility] [place] [place] [address] [phone]  [place], [place] [address] [phone]  [facility]  [place] [address] [phone] [facility]  [address], [place] [address] [phone]  [facility]  [place] [place] [address] [phone] [facility]  [address] [place] [address] [phone]  [facility]  [address] [place] [address] [place]  [facility]  [address], [place] [address] [phone]  [place]  [place] [place] [address] [place]  [facility]  [address], [place] [address] [phone]  [facility]  [address] [place] [address] [phone]  [place], MSW, LCSW-A  10:17 PM 02/26/2022

## 2022-02-26 NOTE — ED Notes (Signed)
Pt called out. Cussing at staff and now yelling at security and [employer]. C/o uncleanliness of the ED atmosphere. Demands to be transferred to another facility where pt can "receive the care that she deserves"

## 2022-02-26 NOTE — ED Notes (Signed)
[facility] SW here to speak with patient at this time.

## 2022-02-26 NOTE — ED Provider Notes (Signed)
Asked to see the patient as she was becoming very agitated.  Patient reports that the emergency department is unclean and she does not have access to soap and hand sanitizer.  Patient reports that she would like to be transferred to another emergency department.  Patient has intermittently become aggressive with the staff.  Patient informed that I cannot transfer her.  She denies homicidality and suicidality.  Patient was told that she can stay here and receive a TTS evaluation or she can leave.  After fair amount of redirection by staff, security and [place] police, patient has calmed down.  Blood work had been reviewed previously, no acute findings.  Her urinalysis has returned.  It is suggestive of infection, however does appear somewhat contaminated.  She does have a history of recurrent E. coli UTI.  She reports that she does not have insurance and has not been taking any of her medications.  We will therefore opt to treat with fosfomycin, as it is single dose.  She does not appear septic at this time.   Orpah Greek, MD 02/26/22 [phone]

## 2022-02-27 DIAGNOSIS — F339 Major depressive disorder, recurrent, unspecified: Secondary | ICD-10-CM

## 2022-02-27 MED ORDER — FLUDROCORTISONE ACETATE 0.1 MG PO TABS
0.1000 mg | ORAL_TABLET | Freq: Every day | ORAL | Status: DC
  Administered 2022-02-27: 0.1 mg via ORAL
  Filled 2022-02-27 (×3): qty 1

## 2022-02-27 MED ORDER — OLANZAPINE 5 MG PO TBDP
5.0000 mg | ORAL_TABLET | Freq: Once | ORAL | Status: AC
  Administered 2022-02-27: 5 mg via ORAL
  Filled 2022-02-27: qty 1

## 2022-02-27 MED ORDER — ACETAMINOPHEN 325 MG PO TABS: 650.0000 mg | ORAL_TABLET | Freq: Four times a day (QID) | ORAL | Status: DC | PRN

## 2022-02-27 MED ORDER — OLANZAPINE 5 MG PO TBDP: 10.0000 mg | ORAL_TABLET | Freq: Every day | ORAL | Status: DC

## 2022-02-27 MED ORDER — FAMOTIDINE 20 MG PO TABS
40.0000 mg | ORAL_TABLET | Freq: Two times a day (BID) | ORAL | Status: DC
  Administered 2022-02-27 (×2): 40 mg via ORAL
  Filled 2022-02-27 (×2): qty 2

## 2022-02-27 MED ORDER — IBUPROFEN 400 MG PO TABS
600.0000 mg | ORAL_TABLET | Freq: Four times a day (QID) | ORAL | Status: DC | PRN
  Administered 2022-02-27: 600 mg via ORAL
  Filled 2022-02-27: qty 2

## 2022-02-27 NOTE — ED Notes (Signed)
Pt given phone and allowed 5 minutes

## 2022-02-27 NOTE — ED Notes (Signed)
Pt expressed concerns with taking fludrocortisone, in reference to labwork. Pharmacy contacted and medication safety was verified.

## 2022-02-27 NOTE — ED Notes (Addendum)
Pt is up for discharge at this time- pt says "[employer] officer said he would take her back home as they had brought pt here." Will f/u on this with [employer]

## 2022-02-27 NOTE — Progress Notes (Signed)
Per Oneida Alar, NP, patient meets criteria for inpatient treatment. There are no available beds at [facility] today. CSW faxed referrals to the following facilities for review:  [facility] [address], [place] [address] [phone] --  [place] N/[place], [place] [address] [phone] [address] [place] [address] [phone] --  [place] N/A [place], [place] [address] [phone] --  [facility]  Pending - Request Sent N/A [address] [place] [address] [phone] --  [facility]  Pending - Request Sent N/A [address], [place] [address] [phone] --  [facility]  Pending - Request Sent N/A [address] [place] [address] [phone] --  [facility]  Pending - Request Sent N/A [place] [address] [place] [address] [phone] --  [facility]  Pending - Request Sent N/A [address] [place] [address] [phone] --  [facility]  Pending - Request Sent N/A [address], [place] [address] [phone] --  [facility]  Pending - Request Sent N/A [address] [place] [address] [phone] --  [facility]  Pending - Request Sent N/A [place] [place] [address] [phone] --  [place] N/A [address], [place] [address] [phone] --  [facility]  Pending - Request Sent  N/A [place], [place] [address] [phone] --  [facility]  Pending - Request Sent N/A [address], [place] [address] [phone] --  [place]  Pending - Request Sent N/A [place]., [place] [address] [phone] --  [facility]  Pending - Request Sent N/A [address] [place] [address] [phone] --  [facility]  Pending - Request Sent N/A [place]., [place] [address] [phone] --  [facility]  Pending - No Request Sent N/A [address] [place] [address] [phone] --  [place] No Request Sent N/A [address], [place] [address] [phone] --  [facility]  Pending - No Request Sent N/A [address] [place] [address] [phone] --  [facility]  Pending - No Request Sent N/A [address], [place] [address] [phone] --   TTS will continue to seek bed placement.  [place], MSW, [place] Phone: [phone] Disposition/TOC

## 2022-02-27 NOTE — ED Notes (Signed)
Pt says she wants to go home, would like to sign AMA papers- Dr Karle Starch made aware. EDP will come talk with pt.

## 2022-02-27 NOTE — ED Notes (Signed)
Breakfast tray given to pt 

## 2022-02-27 NOTE — ED Notes (Signed)
Dr Karle Starch at bedside talking with pt- pt given banana for snack per request

## 2022-02-27 NOTE — ED Provider Notes (Signed)
Called to patient's room, she is asking to be discharged. She is here for psychiatric evaluation, has been seen by TTS and recommended for inpatient evaluation, however she is frustrated that her chronic medical issues are not being addressed although she is not able to clearly communicate what problems she's concerned about, however she seems mostly concerned about her adrenal insufficiency and her PICC line. She does not have any signs of adrenal crisis by labs or vitals. She had a positive urine culture for klebsiella, treated with fosfomycin. She denies any suicidal ideation, recognizes she needs help and will continue to pursue that as an outpatient. Not a danger to self or others and unable to hold her against her will at this time. Encouraged to call her PCP and/or mental health doctors on Monday. RTED for any other needs.    Truddie Hidden, MD 02/27/22 [phone]

## 2022-02-27 NOTE — ED Notes (Signed)
Pt "special friend" called requesting to visit with pt- informed of visiting hours, reports he will come see her tomorrow during visiting hours and to let her know- pt was informed of this.

## 2022-02-27 NOTE — ED Notes (Signed)
TTS in progress 

## 2022-02-27 NOTE — Discharge Instructions (Signed)
Your UTI was treated with antibiotics while you were in the ER. Please continue your other medications as directed by your doctors. Follow up with whichever doctor placed the PICC line to discuss removal.

## 2022-02-27 NOTE — ED Notes (Signed)
Pt c/o of generalized pain the right leg from thigh to heel. Tenderness noted in knee area and lower leg, just above ankle. Multiple small bruises in various stages of healing noted. Swelling noted in the knee. Pt weight bearing and ROM limited on right leg.

## 2022-02-27 NOTE — ED Notes (Signed)
Avasis placed in pt room- telesitter called -camera is on and active

## 2022-02-27 NOTE — Consult Note (Cosign Needed Addendum)
Telepsych Consultation   Reason for Consult:  depression Referring Physician:  Orpah Greek, MD Location of Patient: [facility] [phone] Location of Provider: [facility] Department  Patient Identification: Melanie Graves MRN:  [id number] Principal Diagnosis: MDD (major depressive disorder), recurrent episode, with atypical features ([place]) Diagnosis:  Principal Problem:   MDD (major depressive disorder), recurrent episode, with atypical features ([place])   Total Time spent with patient: 30 minutes  Subjective:   Melanie Graves is a 36 y.o. female patient admitted with reported depression.Patient presents alert and oriented to person, place, partial situation; unable to state appropriate date. Mood irritable with obvious fluctuations. Reports meeting with social worker ([facility]) yesterday, unable to recall contents of conversation stating she was in a "bad mood". When asked details surrounding change in mood she is unable to explain coherently. Poor historian. Intermittently tearful. Remains disorganized and scattered; circumstantial with details. She insists she doesn't want to harm herself or anyone then makes comments about not being safe and "needing help". She continues to endorse ongoing thoughts of hopelessness. Perseverative on multiple medical ailments. She denies any active auditory or visual hallucinations. Today she mentions "Ms Lenna Sciara from [facility]" as a possible contact for collateral information, says she "lives on the grounds of the shelter". States [place] (noted emergency contact) is a previous immunologist/friend unable to follow details of his current involvement; provided contact information for ex husband and permission to contact the above people for collateral information.   Collateral:  Antionette Poles [phone] ([place], owner) [phone] Her mental state is concerning she texts me all times of night; incoherent texts, calls. Disorganized thoughts.  Instable mood. She is telling me she is in a lot of pain or that she can't cope with the disease or what is going on in her body. She wants to quit or give up. I've been taking her around trying to get her help and the rejection she gets is really bad. Took patient to [facility] and she sat there all day no one saw her. Patient mental state presents in way that self sabotoge. Paranoid. Previously worked in [employer]; unable to perform basic task now. Patient has been trying to get PICC line removed and doctors are refusing for unclear reasons; unable to state which hospital placed PICC line. Patient has been staying on property; allowed pt to move a trailer on the property. Owner transports patient to grocery store, doctor appointments, etc. Has observed patient talking to herself at times. Has witnessed patient in a rage. Has not had any contact with ex-husband; has some contact with son in [place]. Is on a lot medications, but unable to get them due to price and without insurance at this time. Concerned for patient safety due to mental decline.     Legrand Como Christoper Craiger [phone] ex-husband, no answer   HPI:  Melanie Graves is a 36 year old female with past psychiatric history of anxiety who presented to [facility] seeking mental health evaluation. Patient has past medical history of autoimmune disease, adrenal insufficiency, chronic hypokalemia; currently has active PICC line. Medical history noted in [place]; no history of psychiatric hospitalizations noted. She reports majority of family has died out, brother is currently suffering from substance abuse. No outpatient supports noted. UDS+THC, BAL<10.  PDMP reviewed, no active prescriptions noted.   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[place])    solar urticaria    Thyroid disease     Past Surgical History:  Procedure Laterality Date   ABDOMINAL SURGERY     ballon sinus plasty     IR FLUORO GUIDE CV LINE RIGHT  06/08/2021   IR FLUORO GUIDE CV LINE RIGHT  07/21/2021   IR US GUIDE VASC ACCESS RIGHT  07/21/2021   Family History:  Family History  Problem Relation Age of Onset   Renal cancer Mother    Osteoporosis Mother    Lumbar disc disease Mother    Skin cancer Father    Drug abuse Sister    Hepatitis C Brother    Heart disease Brother    Asthma Brother    Family Psychiatric History: substance abuse (brother) Social History:  Social History   Substance and Sexual Activity  Alcohol Use Never     Social History   Substance and Sexual Activity  Drug Use Yes   Types: Marijuana    Social History   Socioeconomic History   Marital status: Single    Spouse name: Not on file   Number of children: Not on file   Years of education: Not on file   Highest education level: Not on file  Occupational History   Not on file  Tobacco Use   Smoking status: Never   Smokeless tobacco: Never  Vaping Use   Vaping Use: Some days   Substances: Nicotine  Substance and Sexual Activity   Alcohol use: Never   Drug use: Yes    Types: Marijuana   Sexual activity: Yes  Other Topics Concern   Not on file  Social History Narrative   Not on file   Social Determinants of Health   Financial Resource Strain: Not on file  Food Insecurity: Not on file  Transportation Needs: Not on file  Physical Activity: Not on file  Stress: Not on file  Social Connections: Not on file   Additional Social History:    Allergies:   Allergies  Allergen Reactions   Cinnamon Anaphylaxis   Barley Grass Hives   Beta Adrenergic Blockers  Other (See Comments)    Patient states it is contraindicated for her.    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" - can take it but has to take zyrtec and zantac prior    Lactose Intolerance (Gi)    Mustard Seed Hives and Itching   Risperdal [Risperidone]     Adverse reactions   Tomato Hives    Labs:  Results for orders placed or performed during the hospital encounter of 02/25/22 (from the past 48 hour(s))  Comprehensive metabolic panel     Status: None   Collection Time: 02/25/22  9:38 PM  Result Value Ref Range   Sodium 137 135 - 145 mmol/L   Potassium 3.6 3.5 - 5.1 mmol/L   Chloride 102 98 - 111 mmol/L   CO2 27 22 - 32 mmol/L   Glucose, Bld 96 70 - 99 mg/dL    Comment: Glucose reference range applies only to samples taken after  fasting for at least 8 hours.   BUN 9 6 - 20 mg/dL   Creatinine, Ser 0.64 0.44 - 1.00 mg/dL   Calcium 9.4 8.9 - 10.3 mg/dL   Total Protein 7.7 6.5 - 8.1 g/dL   Albumin 3.9 3.5 - 5.0 g/dL   AST 17 15 - 41 U/L   ALT 14 0 - 44 U/L   Alkaline Phosphatase 84 38 - 126 U/L   Total Bilirubin 0.5 0.3 - 1.2 mg/dL   GFR, Estimated >60 >60 mL/min    Comment: (NOTE) Calculated using the CKD-EPI Creatinine Equation (2021)    Anion gap 8 5 - 15    Comment: Performed at [facility], [address], [place], [place] [address]  Ethanol     Status: None   Collection Time: 02/25/22  9:38 PM  Result Value Ref Range   Alcohol, Ethyl (B) <10 <10 mg/dL    Comment: (NOTE) Lowest detectable limit for serum alcohol is 10 mg/dL.  For medical purposes only. Performed at [facility], [address], [place], [place] [address]   Salicylate level     Status: Abnormal   Collection Time: 02/25/22  9:38 PM  Result Value Ref Range   Salicylate Lvl <5.9 (L) 7.0 - 30.0 mg/dL    Comment: Performed at [facility], [address], [place], [place] [address]  Acetaminophen level      Status: Abnormal   Collection Time: 02/25/22  9:38 PM  Result Value Ref Range   Acetaminophen (Tylenol), Serum <10 (L) 10 - 30 ug/mL    Comment: (NOTE) Therapeutic concentrations vary significantly. A range of 10-30 ug/mL  may be an effective concentration for many patients. However, some  are best treated at concentrations outside of this range. Acetaminophen concentrations >150 ug/mL at 4 hours after ingestion  and >50 ug/mL at 12 hours after ingestion are often associated with  toxic reactions.  Performed at [facility], [address], [place], [place] [address]   cbc     Status: Abnormal   Collection Time: 02/25/22  9:38 PM  Result Value Ref Range   WBC 9.9 4.0 - 10.5 K/uL   RBC 5.31 (H) 3.87 - 5.11 MIL/uL   Hemoglobin 12.4 12.0 - 15.0 g/dL   HCT 42.1 36.0 - 46.0 %   MCV 79.3 (L) 80.0 - 100.0 fL   MCH 23.4 (L) 26.0 - 34.0 pg   MCHC 29.5 (L) 30.0 - 36.0 g/dL   RDW 17.6 (H) 11.5 - 15.5 %   Platelets 328 150 - 400 K/uL   nRBC 0.0 0.0 - 0.2 %    Comment: Performed at [facility], [address] [place], [place] [address]  Rapid urine drug screen (hospital performed)     Status: Abnormal   Collection Time: 02/26/22 12:26 AM  Result Value Ref Range   Opiates NONE DETECTED NONE DETECTED   Cocaine NONE DETECTED NONE DETECTED   Benzodiazepines NONE DETECTED NONE DETECTED   Amphetamines NONE DETECTED NONE DETECTED   Tetrahydrocannabinol POSITIVE (A) NONE DETECTED   Barbiturates NONE DETECTED NONE DETECTED    Comment: (NOTE) DRUG SCREEN FOR MEDICAL PURPOSES ONLY.  IF CONFIRMATION IS NEEDED FOR ANY PURPOSE, NOTIFY LAB WITHIN 5 DAYS.  LOWEST DETECTABLE LIMITS FOR URINE DRUG SCREEN Drug Class                     Cutoff (ng/mL) Amphetamine and metabolites    1000 Barbiturate and metabolites    200 Benzodiazepine  200 Opiates and metabolites        300 Cocaine and metabolites        300 THC                            50 Performed at [facility], [address], [place], [place] [address]   Urinalysis, Routine w reflex microscopic Urine, Clean Catch     Status: Abnormal   Collection Time: 02/26/22 12:26 AM  Result Value Ref Range   Color, Urine YELLOW YELLOW   APPearance CLOUDY (A) CLEAR   Specific Gravity, Urine 1.027 1.005 - 1.030   pH 5.0 5.0 - 8.0   Glucose, UA NEGATIVE NEGATIVE mg/dL   Hgb urine dipstick NEGATIVE NEGATIVE   Bilirubin Urine NEGATIVE NEGATIVE   Ketones, ur NEGATIVE NEGATIVE mg/dL   Protein, ur 30 (A) NEGATIVE mg/dL   Nitrite POSITIVE (A) NEGATIVE   Leukocytes,Ua SMALL (A) NEGATIVE   RBC / HPF 6-10 0 - 5 RBC/hpf   WBC, UA 21-50 0 - 5 WBC/hpf   Bacteria, UA MANY (A) NONE SEEN   Squamous Epithelial / LPF 21-50 0 - 5   Mucus PRESENT    Ca Oxalate Crys, UA PRESENT     Comment: Performed at [facility], [address], [place], [place] [address]  Urine Culture     Status: Abnormal (Preliminary result)   Collection Time: 02/26/22 12:26 AM   Specimen: Urine, Clean Catch  Result Value Ref Range   Specimen Description      URINE, CLEAN CATCH Performed at [facility], [address] [place], [place] [address]    Special Requests      NONE Performed at [facility], [address], [place],  [address]    Culture >=100,000 COLONIES/mL KLEBSIELLA PNEUMONIAE (A)    Report Status PENDING   POC urine preg, ED     Status: None   Collection Time: 02/26/22 12:29 AM  Result Value Ref Range   Preg Test, Ur NEGATIVE NEGATIVE    Comment:        THE SENSITIVITY OF THIS METHODOLOGY IS >24 mIU/mL     Medications:  Current Facility-Administered Medications  Medication Dose Route Frequency Provider Last Rate Last Admin   acetaminophen (TYLENOL) tablet 650 mg  650 mg Oral Q6H PRN Wyvonnia Dusky, MD       famotidine (PEPCID) tablet 40 mg  40 mg Oral BID AC Wyvonnia Dusky, MD   40 mg at 02/27/22 5093   fludrocortisone (FLORINEF) tablet 0.1 mg  0.1 mg Oral Daily Wyvonnia Dusky, MD   0.1 mg at 02/27/22 1049    hydrOXYzine (ATARAX) tablet 100 mg  100 mg Oral TID PRN Oneida Alar A, NP   100 mg at 02/27/22 0835   ibuprofen (ADVIL) tablet 600 mg  600 mg Oral Q6H PRN Wyvonnia Dusky, MD       [START ON 02/28/2022] OLANZapine zydis (ZYPREXA) disintegrating tablet 10 mg  10 mg Oral Daily Leevy-Johnson, Pama Roskos A, NP       Current Outpatient Medications  Medication Sig Dispense Refill   cetirizine (ZYRTEC) 10 MG tablet Take 10 mg by mouth in the morning and at bedtime.     clemastine (TAVIST) 2.68 MG TABS tablet Take 2.68 mg by mouth daily as needed (allergies).     cromolyn (GASTROCROM) 100 MG/5ML solution Take 200 mg by mouth 4 (four) times daily.     EPINEPHrine (ADRENALIN)  1 MG/ML SOLN Inject 0.3 mLs into the muscle as needed for anaphylaxis.     famotidine (PEPCID) 40 MG tablet Take 40 mg by mouth daily as needed (reflux/to prevent anaphylaxis).     ondansetron (ZOFRAN-ODT) 8 MG disintegrating tablet Take 8 mg by mouth every 8 (eight) hours as needed for nausea or vomiting.     Probiotic Product (PROBIOTIC PO) Take 1 tablet by mouth daily.     XOLAIR 75 MG/0.5ML prefilled syringe Inject 375 mg into the skin every 14 (fourteen) days.     albuterol (VENTOLIN HFA) 108 (90 Base) MCG/ACT inhaler Inhale 1-2 puffs into the lungs every 6 (six) hours as needed for wheezing or shortness of breath. (Patient not taking: Reported on 02/26/2022)     CATHFLO ACTIVASE 2 MG injection 2 mg by Intracatheter route daily as needed for open catheter.     clonazePAM (KLONOPIN) 1 MG tablet Take 1 mg by mouth 4 (four) times daily. (Patient not taking: Reported on 02/26/2022)     cloNIDine (CATAPRES) 0.1 MG tablet Take 0.1 mg by mouth daily as needed (high blood pressure). (Patient not taking: Reported on 02/26/2022)     fludrocortisone (FLORINEF) 0.1 MG tablet Take 0.1 mg by mouth daily. (Patient not taking: Reported on 02/26/2022)     gabapentin (NEURONTIN) 600 MG tablet Take 600 mg by mouth 3 (three) times daily.  (Patient not taking: Reported on 02/26/2022)     hydrOXYzine (VISTARIL) 25 MG capsule Take 100 mg by mouth in the morning and at bedtime. (Patient not taking: Reported on 02/26/2022)     metoCLOPramide (REGLAN) 10 MG tablet Take 1 tablet (10 mg total) by mouth every 6 (six) hours. (Patient not taking: Reported on 02/26/2022) 30 tablet 0   scopolamine (TRANSDERM-SCOP) 1 MG/3DAYS Place 1 patch (1.5 mg total) onto the skin every 3 (three) days. (Patient not taking: Reported on 02/26/2022) 10 patch 0   spironolactone (ALDACTONE) 25 MG tablet Take 25 mg by mouth daily. (Patient not taking: Reported on 02/26/2022)     SYMBICORT 160-4.5 MCG/ACT inhaler Inhale 2 puffs into the lungs in the morning and at bedtime. (Patient not taking: Reported on 02/26/2022)     tezepelumab-ekko (TEZSPIRE) 210 MG/1.91ML syringe Inject 210 mg into the skin every 30 (thirty) days. (Patient not taking: Reported on 02/26/2022)     tiZANidine (ZANAFLEX) 4 MG tablet Take 4 mg by mouth in the morning and at bedtime. (Patient not taking: Reported on 02/26/2022)     Vitamin D, Ergocalciferol, (DRISDOL) 1.25 MG (50000 UNIT) CAPS capsule Take 50,000 Units by mouth 2 (two) times a week. No set days (Patient not taking: Reported on 02/26/2022)      Musculoskeletal: Strength & Muscle Tone: within normal limits Gait & Station: normal Patient leans: N/A  Psychiatric Specialty Exam:  Presentation  General Appearance:  Casual  Eye Contact: Fair  Speech: Clear and Coherent  Speech Volume: Normal  Handedness: Right  Mood and Affect  Mood: Irritable; Labile; Dysphoric  Affect: Non-Congruent; Labile; Flat  Thought Process  Thought Processes: Disorganized  Descriptions of Associations:Circumstantial  Orientation:Partial  Thought Content:Scattered; Perseveration  History of Schizophrenia/Schizoaffective disorder:No data recorded Duration of Psychotic Symptoms:No data recorded Hallucinations:Hallucinations:  None  Ideas of Reference:None  Suicidal Thoughts:Suicidal Thoughts: No  Homicidal Thoughts:Homicidal Thoughts: No  Sensorium  Memory: Immediate Fair; Recent Fair  Judgment: Impaired  Insight: Shallow  Executive Functions  Concentration: Poor  Attention Span: Poor  Recall: Poor  Fund of Knowledge: Fair  Language: Fair  Psychomotor Activity  Psychomotor Activity: Psychomotor Activity: Normal  Assets  Assets: Resilience  Sleep  Sleep: Sleep: Fair  Physical Exam: Physical Exam Vitals and nursing note reviewed.  Constitutional:      Appearance: She is ill-appearing.  HENT:     Head: Normocephalic.     Nose: Nose normal.     Mouth/Throat:     Mouth: Mucous membranes are moist.     Pharynx: Oropharynx is clear.  Eyes:     Pupils: Pupils are equal, round, and reactive to light.  Cardiovascular:     Rate and Rhythm: Tachycardia present.  Pulmonary:     Effort: Pulmonary effort is normal.  Musculoskeletal:     Cervical back: Normal range of motion.  Skin:    General: Skin is dry.  Neurological:     Mental Status: She is alert.  Psychiatric:        Attention and Perception: She does not perceive auditory or visual hallucinations.        Mood and Affect: Affect is labile and tearful.        Speech: Speech is tangential.        Behavior: Behavior is withdrawn.        Thought Content: Thought content is delusional. Thought content does not include homicidal or suicidal ideation. Thought content does not include homicidal or suicidal plan.        Cognition and Memory: Memory is impaired.        Judgment: Judgment is impulsive.    Review of Systems  Musculoskeletal:  Positive for myalgias.  Psychiatric/Behavioral:  Positive for depression.    Blood pressure 127/78, pulse 98, temperature 97.9 F (36.6 C), temperature source Oral, resp. rate 12, last menstrual period 02/18/2022, SpO2 100 %. There is no height or weight on file to calculate  BMI.  Treatment Plan Summary: Daily contact with patient to assess and evaluate symptoms and progress in treatment, Medication management, and Plan Increase Zyprexa to 10 mg daily, continue to seek inpatient hospitalization. EDP consulted regarding PICC line; not removing at this time.   Disposition: Recommend psychiatric Inpatient admission when medically cleared. Supportive therapy provided about ongoing stressors. Discussed crisis plan, support from social network, calling 911, coming to the Emergency Department, and calling Suicide Hotline.  This service was provided via telemedicine using a 2-way, interactive audio and video technology.  Names of all persons participating in this telemedicine service and their role in this encounter. Name: Oneida Alar Role: PMHNP  Name: [place] Abbot Role: Attending MD  Name: Salena Saner Role: patient  Name: Antionette Poles Role: [facility] owner    Inda Merlin, NP 02/27/2022 1:13 PM

## 2022-02-27 NOTE — Progress Notes (Signed)
CSW spoke with Pamala Hurry with [facility] admissions. It was reported that this patient will be further reviewed tomorrow for potential placement.  Glennie [place], MSW, [facility] Phone: [phone] Disposition/TOC

## 2022-02-27 NOTE — ED Provider Notes (Signed)
Emergency Medicine Observation Re-evaluation Note  Melanie Graves is a 36 y.o. female, seen on rounds today.  Pt initially presented to the ED for complaints of myalgias, behavior disturbances, anxiety. Currently, the patient is resting.  Physical Exam  BP 127/78 (BP Location: Right Arm)   Pulse 98   Temp 97.9 F (36.6 C) (Oral)   Resp 12   LMP 02/18/2022   SpO2 100%  Physical Exam General: NAD Cardiac: Regular heart rate Lungs: No respiratory distress Psych: Stable  ED Course / MDM  EKG:   I have reviewed the labs performed to date as well as medications administered while in observation.  Recent changes in the last 24 hours include Urine cx + for gram neg rods > 100K colony units.  Patient was treated for UTI with fosfomycin single dose yesterday.  Plan  Current plan is for inpatient psychiatric placement per psych consultation, with concern for MDD.  I did review the patient's prior medical records.  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 coli units.  She was treated with Keflex.  She was on steroids in the hospital.  She is noted to have a PICC line placed, after prior line had been dislodged.  There were unfortunately several social barriers to care, including housing insecurity and financial insecurity.  Per my review of her work-up in the ED, she does not demonstrate evidence of sepsis or adrenal insufficiency at this time.  UDS is positive for THC.  She is treated appropriately for UTI with fosfomycin yesterday.  There is no medical indication for hospitalization or admission at this time, but she is requiring further psychiatric services.    Wyvonnia Dusky, MD 02/27/22 [phone]

## 2022-02-28 NOTE — ED Notes (Signed)
Tele monitor removed so pt can get dressed.

## 2022-03-01 LAB — URINE CULTURE: Culture: >=100000 — AB

## 2022-03-02 ENCOUNTER — Telehealth (HOSPITAL_BASED_OUTPATIENT_CLINIC_OR_DEPARTMENT_OTHER): Payer: Self-pay | Admitting: *Deleted

## 2022-03-02 NOTE — Progress Notes (Signed)
ED Antimicrobial Stewardship Positive Culture Follow Up   Melanie Graves is an 36 y.o. female who presented to [facility] on 02/25/2022 with a chief complaint of psych evaluation. Patient is homeless and has not been taking her medications because she doesn't have insurance.  Recent Results (from the past 720 hour(s))  Urine Culture     Status: Abnormal   Collection Time: 02/26/22 12:26 AM   Specimen: Urine, Clean Catch  Result Value Ref Range Status   Specimen Description   Final    URINE, CLEAN CATCH Performed at [facility], [address] [place], [place] [address]    Special Requests   Final    NONE Performed at [facility], [address] [place], [place] [address]    Culture >=100,000 COLONIES/mL KLEBSIELLA PNEUMONIAE (A)  Final   Report Status 03/01/2022 FINAL  Final   Organism ID, Bacteria KLEBSIELLA PNEUMONIAE (A)  Final      Susceptibility   Klebsiella pneumoniae - MIC*    AMPICILLIN >=32 RESISTANT Resistant     CEFAZOLIN <=4 SENSITIVE Sensitive     CEFEPIME <=0.12 SENSITIVE Sensitive     CEFTRIAXONE <=0.25 SENSITIVE Sensitive     CIPROFLOXACIN <=0.25 SENSITIVE Sensitive     GENTAMICIN <=1 SENSITIVE Sensitive     IMIPENEM <=0.25 SENSITIVE Sensitive     NITROFURANTOIN 64 INTERMEDIATE Intermediate     TRIMETH/SULFA <=20 SENSITIVE Sensitive     AMPICILLIN/SULBACTAM >=32 RESISTANT Resistant     PIP/TAZO 32 INTERMEDIATE Intermediate     * >=100,000 COLONIES/mL KLEBSIELLA PNEUMONIAE   Patient presented to ED with complaints of fatigue and pain all over her body. Patient is homeless and does not have insurance. She does not complain of urinary symptoms, and UA was contaminated. However, MD is concerned that patient is altered and may not be giving a full history of urinary symptoms. Therefore, Dr. Matilde Sprang would like to send a prescription out for her to treat a possible UTI.  New antibiotic prescription: cefadroxil 500 mg twice daily for 7 days  ED Provider: Teressa Lower, MD  Louanne Belton, PharmD, [facility] PGY1 Pharmacy Resident 03/02/2022 9:16 AM

## 2022-03-02 NOTE — Telephone Encounter (Signed)
Post ED Visit - Positive Culture Follow-up: Successful Patient Follow-Up  Culture assessed and recommendations reviewed by:  '[]'$  Elenor Quinones, Pharm.D. '[]'$  Heide Guile, Pharm.D., BCPS AQ-ID '[]'$  Parks Neptune, Pharm.D., BCPS '[]'$  Alycia Rossetti, Pharm.D., BCPS '[]'$  [place], Pharm.D., BCPS, AAHIVP '[]'$  Legrand Como, Pharm.D., BCPS, AAHIVP '[]'$  Salome Arnt, PharmD, BCPS '[]'$  Johnnette Gourd, PharmD, BCPS '[]'$  Hughes Better, PharmD, BCPS '[]'$  Leeroy Cha, PharmD  Positive urine culture  '[x]'$  Patient discharged without antimicrobial prescription and treatment is now indicated '[]'$  Organism is resistant to prescribed ED discharge antimicrobial '[]'$  Patient with positive blood cultures  Changes discussed with ED provider: Teressa Lower, MD New antibiotic prescription Cefadroxil '500mg'$  PO BID x 7 days Pt declined additional treatment  Contacted patient, date 03/02/2022, time 1015am   Ardeen Fillers 03/02/2022, 10:16 AM

## 2022-04-12 ENCOUNTER — Ambulatory Visit: Payer: Medicaid Other | Admitting: Internal Medicine

## 2022-05-31 ENCOUNTER — Other Ambulatory Visit (HOSPITAL_COMMUNITY): Payer: Self-pay | Admitting: Pediatrics

## 2022-05-31 DIAGNOSIS — D8942 Idiopathic mast cell activation syndrome: Secondary | ICD-10-CM

## 2022-06-09 ENCOUNTER — Ambulatory Visit (HOSPITAL_COMMUNITY)
Admission: RE | Admit: 2022-06-09 | Discharge: 2022-06-09 | Disposition: A | Discharge status: Home / Self Care | Payer: Medicaid Other | Source: Ambulatory Visit | Attending: Pediatrics | Admitting: Pediatrics

## 2022-06-09 DIAGNOSIS — Z452 Encounter for adjustment and management of vascular access device: Secondary | ICD-10-CM | POA: Diagnosis not present

## 2022-06-09 DIAGNOSIS — D8942 Idiopathic mast cell activation syndrome: Secondary | ICD-10-CM | POA: Diagnosis not present

## 2022-06-09 HISTORY — PX: IR REMOVAL TUN CV CATH W/O FL: IMG2289

## 2022-06-09 NOTE — Procedures (Signed)
IR asked to removed tunneled central venous catheter. Line removed per order. Patient tolerated removal well. Please see full dictation under the imaging tab in Epic.  Soyla Dryer,  [phone] 06/09/2022, 1:57 PM

## 2022-07-15 ENCOUNTER — Encounter: Payer: Self-pay | Admitting: Radiology

## 2023-02-03 ENCOUNTER — Encounter: Payer: Self-pay | Admitting: Endocrinology

## 2023-02-03 ENCOUNTER — Ambulatory Visit: Payer: Medicare HMO | Admitting: Endocrinology

## 2023-02-03 VITALS — BP 118/70 | HR 94 | Resp 18 | Ht 67.0 in | Wt 171.4 lb

## 2023-02-03 DIAGNOSIS — E2740 Unspecified adrenocortical insufficiency: Secondary | ICD-10-CM

## 2023-02-03 DIAGNOSIS — E041 Nontoxic single thyroid nodule: Secondary | ICD-10-CM

## 2023-02-03 MED ORDER — HYDROCORTISONE 10 MG PO TABS: ORAL_TABLET | ORAL | 3 refills | Status: DC

## 2023-02-03 NOTE — Patient Instructions (Signed)
Hydrocortisone 20 mg in the morning and 10 mg in the last afternoon 2-4 PM.  Hold and monitor fludrocortisone for now.  Take 2 times usual dose of hydrocortisone for minor illness and 3 times for major illness. For 3-5 days and go to usual dose. If you can not tolerate oral medication, need to go to ER for IV steroid.  Get medical alert for adrenal insufficiency.   Will plan for US thyroid.

## 2023-02-03 NOTE — Progress Notes (Signed)
Outpatient Endocrinology Note [place] Del Wiseman, MD  02/03/23  Patient's Name: Melanie Graves    DOB: August 29, 1985    MRN: [id number]  REASON OF VISIT: New consult for evaluation of low cortisol/abnormal cortisol levels/adrenal insufficiency.  REFERRING PROVIDER: Joaquin Courts, DO  PCP:  Pcp, No  HISTORY OF PRESENT ILLNESS:   Melanie Graves is a 37 y.o. old female with past medical history listed below, is here for evaluation of low cortisol/abnormal cortisol levels/adrenal insufficiency.  Pertinent history: Patient has complicated and complex medical history.  Patient is referred to endocrinology for evaluation of abnormal cortisol level and concern of adrenal insufficiency.  Patient was evaluated in December 30, 2022 by primary care provider and later started, and patient has been taking hydrocortisone 10 mg 3 times a day and fludrocortisone 0.1 mg 2 times a day for about 10 days.    Referral medical record from Dr. 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  Recurrent major depression, anxiety disorder, PTSD, mast cell disorder, mast cell activation syndrome, chronic pain syndrome etc. She has history of substance use/marijuana use. She was evaluated for leg/right knee surgery for preoperative medical evaluation. With a primary care review mentioned that she was medically cleared for surgery pending lab, mentioned about adrenal insufficiency and monitoring blood pressure and possible reaction due to low cortisol level. Patient has antinuclear antibody detected, has been taking Plaquenil and primary care as referred to rheumatology.  On August 15 patient had checked ACTH which was undetectable <1.5 and cortisol 0.8 in the morning 8:44 AM. Labs on August 15 TSH 1.550 normal.  Iron studies normal.  Magnesium normal.  CMP with normal serum sodium 138, glucose 84, potassium 3.9, normal  serum calcium 9.4, normal liver enzymes, creatinine 1.52, EGFR 122.  With review of chart patient had multiple ER visits with various complaints including myalgia, behavioral disturbance, anxiety.  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  She has very social barriers to care including housing insecurity and financial insecurity.    Patient states that she had ER visits at least 4 times in July 2024 in [place] due to complaints of severe weakness, sickness with nausea, vomiting, decreased appetite, she reports that she felt better after receiving steroid in the ER.  She mentioned that she requested to treat for adrenal insufficiency in the ER visit, she mentions she had her cortisol checked and was low no records available to review.  Patient mentioned that she has mast cell activation syndrome, in the past she was following with immunology, she had required TPN, tube feeding via G-tube, for various reasons, no detail records available, she mentions she had also required long-term IV glucocorticoid for inflammation and also for adrenal insufficiency.  However she has not been on any form of glucocorticoid regularly from summer 2023.  She states she was doing okay without glucocorticoid replacement at that time.  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  She has been following with orthopedic and had multiple cortisone injection/triamcinolone, at least 4 times, with records reviewed from her patient portal on the phone injection was in November 25, 2022 and December 27, 2022.  Patient reports there is a plan for right knee surgery and working with her primary care provider for medical clearance.  Patient mentioned that she had been on glucocorticoid replacement prednisone, dexamethasone, Solu-Medrol, Solu-Cortef at  various times in the past.  On December 30, 2018 4 in the morning 8:44 AM patient had cortisol of 0.8 and ACTH undetectable -1.5 however she had cortisone injection to her right knee triamcinolone on August 12.  Patient is concerned about these levels, explained that level of cortisol of 0.8 and undetectable ACTH is expected and normal in this context of prior steroid injection.  Patient reports history of thyroid nodules about 10 years ago when she was evaluated in [place].  She states she has radioactive iodine uptake and scan and told to have multiple thyroid nodules including partially cystic and solid.  She does not recall having biopsy however she never had follow-up ultrasound after that.  Patient had normal TSH of 1.550 in August 2024.  She is not on thyroid medication.  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  She has 3 babies, one of the deliveries was emergency cesarean section.  Patient mentioned that she did not have menstrual cycle for sometime however recently having irregular menstrual cycle.  She was also referred to OB/GYN for the evaluation and management.  REVIEW OF SYSTEMS:  As per history of present illness.   PAST MEDICAL HISTORY: Past Medical History:  Diagnosis Date   ADD (attention deficit disorder)    Adrenal cortical hypofunction (HCC)    allergic rhinnitis    Arthritis    Asthma    Immune deficiency disorder (HCC)    Leukocytosis    Lyme disease    MCAD (medium-chain acyl-CoA dehydrogenase deficiency) (HCC)    solar urticaria    Thyroid disease     PAST SURGICAL HISTORY: Past Surgical History:  Procedure Laterality Date   ABDOMINAL SURGERY     ballon sinus plasty     IR FLUORO GUIDE CV LINE RIGHT  06/08/2021   IR FLUORO GUIDE CV LINE RIGHT  07/21/2021   IR REMOVAL TUN CV CATH W/O FL  06/09/2022   IR US GUIDE VASC ACCESS RIGHT  07/21/2021    ALLERGIES: Allergies  Allergen Reactions   Cinnamon Anaphylaxis   Barley  Grass Hives   Beta Adrenergic Blockers Other (See Comments)    Patient states it is contraindicated for her.    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" - can take it but has to take zyrtec and zantac prior    Lactose Intolerance (Gi)    Mustard Hives and Itching   Risperdal [Risperidone]     Adverse reactions   Tomato Hives    FAMILY HISTORY:  Family History  Problem Relation Age of Onset   Renal cancer Mother    Osteoporosis Mother    Lumbar disc disease Mother    Skin cancer Father    Drug abuse Sister    Hepatitis C Brother    Heart disease Brother    Asthma Brother     SOCIAL HISTORY: Social History   Socioeconomic History   Marital status: Divorced    Spouse name: Not on file   Number of children: Not on file   Years of education: Not on file   Highest education level: Not on file  Occupational History   Not on file  Tobacco Use   Smoking status: Never   Smokeless tobacco: Never  Vaping Use   Vaping status:  Some Days   Substances: Nicotine  Substance and Sexual Activity   Alcohol use: Never   Drug use: Yes    Types: Marijuana   Sexual activity: Yes  Other Topics Concern   Not on file  Social History Narrative   Not on file   Social Determinants of Health   Financial Resource Strain: Not on file  Food Insecurity: Not on file  Transportation Needs: Not on file  Physical Activity: Not on file  Stress: Not on file  Social Connections: Not on file    MEDICATIONS:  Current Outpatient Medications  Medication Sig Dispense Refill   albuterol (VENTOLIN HFA) 108 (90 Base) MCG/ACT inhaler Inhale 1-2 puffs into the lungs every 6 (six) hours as needed for wheezing or shortness of breath.     cetirizine (ZYRTEC) 10 MG tablet Take 10 mg by mouth in the morning and at bedtime.     cromolyn (GASTROCROM) 100 MG/5ML solution Take 200 mg by mouth 4 (four) times  daily.     EPINEPHrine (ADRENALIN) 1 MG/ML SOLN Inject 0.3 mLs into the muscle as needed for anaphylaxis.     hydrocortisone (CORTEF) 10 MG tablet Take 2 tab in the morning and 1 tab in the afternoon 2-4 PM and increase dose as needed as instructed. 350 tablet 3   hydrOXYzine (VISTARIL) 25 MG capsule Take 100 mg by mouth in the morning and at bedtime.     SYMBICORT 160-4.5 MCG/ACT inhaler Inhale 2 puffs into the lungs in the morning and at bedtime.     tezepelumab-ekko (TEZSPIRE) 210 MG/1. syringe Inject 210 mg into the skin every 30 (thirty) days.     Vitamin D, Ergocalciferol, (DRISDOL) 1.25 MG (50000 UNIT) CAPS capsule Take 50,000 Units by mouth 2 (two) times a week. No set days     XOLAIR 75 MG/0.5ML prefilled syringe Inject 375 mg into the skin every 14 (fourteen) days.     CATHFLO ACTIVASE 2 MG injection 2 mg by Intracatheter route daily as needed for open catheter.     clemastine (TAVIST) 2.68 MG TABS tablet Take 2.68 mg by mouth daily as needed (allergies).     clonazePAM (KLONOPIN) 1 MG tablet Take 1 mg by mouth 4 (four) times daily. (Patient not taking: Reported on 02/26/2022)     cloNIDine (CATAPRES) 0.1 MG tablet Take 0.1 mg by mouth daily as needed (high blood pressure). (Patient not taking: Reported on 02/26/2022)     famotidine (PEPCID) 40 MG tablet Take 40 mg by mouth daily as needed (reflux/to prevent anaphylaxis).     fludrocortisone (FLORINEF) 0.1 MG tablet Take 0.1 mg by mouth daily. (Patient not taking: Reported on 02/26/2022)     gabapentin (NEURONTIN) 600 MG tablet Take 600 mg by mouth 3 (three) times daily. (Patient not taking: Reported on 02/26/2022)     metoCLOPramide (REGLAN) 10 MG tablet Take 1 tablet (10 mg total) by mouth every 6 (six) hours. (Patient not taking: Reported on 02/26/2022) 30 tablet 0   ondansetron (ZOFRAN-ODT) 8 MG disintegrating tablet Take 8 mg by mouth every 8 (eight) hours as needed for nausea or vomiting.     Probiotic Product (PROBIOTIC PO) Take  1 tablet by mouth daily.     scopolamine (TRANSDERM-SCOP) 1 MG/3DAYS Place 1 patch (1.5 mg total) onto the skin every 3 (three) days. (Patient not taking: Reported on 02/26/2022) 10 patch 0   spironolactone (ALDACTONE) 25 MG tablet Take 25 mg by mouth daily. (Patient not taking: Reported on 02/26/2022)  tiZANidine (ZANAFLEX) 4 MG tablet Take 4 mg by mouth in the morning and at bedtime. (Patient not taking: Reported on 02/26/2022)     No current facility-administered medications for this visit.    PHYSICAL EXAM: Vitals:   02/03/23 1510  BP: 118/70  Pulse: 94  Resp: 18  SpO2: 99%  Weight: 171 lb 6.4 oz (77.7 kg)  Height: 5\' 7"  (1.702 m)   Body mass index is 26.85 kg/m.   General: Well developed, well nourished female in no apparent distress. Appropriate for age.  HEENT: AT/, no external lesions. Hearing intact to the spoken word Eyes: Conjunctiva clear and no icterus. Neck: Trachea midline, neck supple without appreciable thyromegaly or lymphadenopathy and no palpable thyroid nodules Lungs: Clear to auscultation, no wheeze. Respirations not labored Heart: S1S2, Regular in rate and rhythm. Abdomen: Soft, non tender Neurologic: Alert, oriented, normal speech, deep tendon biceps reflexes normal,  no gross focal neurological deficit Extremities: b/l pedal pitting edema, no tremors of outstretched hands. Right knee with bandage.  Skin: Warm, color good.   PERTINENT HISTORIC LABORATORY AND IMAGING STUDIES:  All pertinent laboratory results were reviewed. Please see HPI also for further details.   ASSESSMENT / PLAN  1. Adrenal insufficiency (HCC)   2. Thyroid nodule    -Patient likely has adrenal insufficiency secondary to long-term use of exogenous glucocorticoid.  At least in the last few months he has been on multiple injection of triamcinolone for knee and shoulder including most recent injections were in July and in August 2024.  She had been on various systemic steroid  including prednisone, dexamethasone, Solu-Cortef in the past at various times.  -Patient had undetectable ACTH and cortisol of 0.8 in the morning on August 15 however she had triamcinolone injection on August 12, these levels of ACTH and cortisol are appropriate in this context.  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  -Patient is planning for right knee surgery in the near future.  Patient is currently taking hydrocortisone 10 mg 3 times a day and fludrocortisone 0.1 mg 2 times a day.  Patient also mentioned that she might need more steroid injection into her knee and shoulder in coming days.  In this context. I would not plan for ACTH stimulation test for the definitive test for adrenal insufficiency.  It is very likely that she has adrenal insufficiency secondary to exogenous use of glucocorticoid.  Plan: -Change hydrocortisone to 20 mg in the morning and 10 mg in the late afternoon 2 to 4 PM.  Discussed about illness rule of taking extra dose hydrocortisone to take 2 times the usual dose in case of minor illness and up to 3 times the usual dose in case of major  illness.  Discussed that if she cannot tolerate oral medications need to go to ER for IV steroid. -Advised to get medical alert for adrenal insufficiency. -Hold fludrocortisone, probably she does not require it.  Patient has bilateral pedal edema.  Advised to monitor blood pressure at home, if her blood pressure became low can restart fludrocortisone as well. -In case of surgery she will need stress dose of IV steroid in perioperative period. -Check BMP today.  # Thyroid nodules -Patient reported history of having multiple thyroid nodules about 10 years ago.  No records available.  She had normal TSH in August 2024. -Will check ultrasound thyroid  Diagnoses and all orders for  this visit:  Adrenal insufficiency (HCC) -      Basic metabolic panel; Future -     Basic metabolic panel  Thyroid nodule -     US THYROID; Future  Other orders -     hydrocortisone (CORTEF) 10 MG tablet; Take 2 tab in the morning and 1 tab in the afternoon 2-4 PM and increase dose as needed as instructed.    DISPOSITION Follow up in clinic in 6 weeks suggested.  All questions answered and patient verbalized understanding of the plan.   [place] Jude Naclerio, MD [facility] Endocrinology [facility] [address] [place], [address] [place], [place] [address] Phone # [phone]  At least part of this note was generated using voice recognition software. Inadvertent word errors may have occurred, which were not recognized during the proofreading process.

## 2023-02-04 LAB — BASIC METABOLIC PANEL
BUN: 8 mg/dL (ref 6–23)
CO2: 28 mEq/L (ref 19–32)
Calcium: 9.2 mg/dL (ref 8.4–10.5)
Chloride: 103 mEq/L (ref 96–112)
Creatinine, Ser: 0.7 mg/dL (ref 0.40–1.20)
GFR: 110.32 mL/min (ref >60.00)
Glucose, Bld: 94 mg/dL (ref 70–99)
Potassium: 3.8 mEq/L (ref 3.5–5.1)
Sodium: 140 mEq/L (ref 135–145)

## 2023-02-11 ENCOUNTER — Ambulatory Visit
Admission: RE | Admit: 2023-02-11 | Discharge: 2023-02-11 | Disposition: A | Discharge status: Home / Self Care | Payer: Medicare HMO | Source: Ambulatory Visit | Attending: Endocrinology

## 2023-02-11 DIAGNOSIS — E041 Nontoxic single thyroid nodule: Secondary | ICD-10-CM

## 2023-03-15 ENCOUNTER — Ambulatory Visit: Payer: Medicaid Other | Admitting: Endocrinology

## 2023-04-04 ENCOUNTER — Ambulatory Visit: Payer: Medicare HMO | Admitting: Endocrinology

## 2023-04-13 ENCOUNTER — Encounter: Payer: Self-pay | Admitting: Endocrinology

## 2023-04-13 ENCOUNTER — Ambulatory Visit: Payer: Medicare HMO | Admitting: Endocrinology

## 2023-04-13 VITALS — BP 116/60 | HR 93 | Resp 20 | Ht 67.0 in | Wt 180.4 lb

## 2023-04-13 DIAGNOSIS — E2740 Unspecified adrenocortical insufficiency: Secondary | ICD-10-CM

## 2023-04-13 NOTE — Progress Notes (Signed)
Outpatient Endocrinology Note [place] Azzure Garabedian, MD  04/13/23  Patient's Name: Melanie Graves    DOB: July 25, 1985    MRN: [id number]  REASON OF VISIT: Follow up of adrenal insufficiency.  REFERRING PROVIDER: Joaquin Courts, DO  PCP:  Pcp, No  HISTORY OF PRESENT ILLNESS:   Melanie Graves is a 37 y.o. old female with past medical history listed below, is here for follow up adrenal insufficiency.  Pertinent history: Patient has complicated and complex medical history.  Patient was referred to endocrinology for evaluation of abnormal cortisol level and concern of adrenal insufficiency, was initially seen in September 2024.  -Per initial consult in September 2024 in endocrinology clinic : patient was evaluated in December 30, 2022 by primary care provider and later started, and patient has been taking hydrocortisone 10 mg 3 times a day and fludrocortisone 0.1 mg 2 times a day.  Referral medical record from Dr. 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  Recurrent major depression, anxiety disorder, PTSD, mast cell disorder, mast cell activation syndrome, chronic pain syndrome etc. She has history of substance use/marijuana use. She was evaluated for leg/right knee surgery for preoperative medical evaluation. With a primary care review mentioned that she was medically cleared for surgery pending lab, mentioned about adrenal insufficiency and monitoring blood pressure and possible reaction due to low cortisol level. Patient has antinuclear antibody detected, has been taking Plaquenil and primary care as referred to rheumatology. On August 15 patient had checked ACTH which was undetectable <1.5 and cortisol 0.8 in the morning 8:44 AM. Labs on August 15 TSH 1.550 normal.  Iron studies normal.  Magnesium normal.  CMP with normal serum sodium 138, glucose 84, potassium 3.9, normal serum calcium 9.4,  normal liver enzymes, creatinine 1.52, EGFR 122. With review of chart patient had multiple ER visits with various complaints including myalgia, behavioral disturbance, anxiety.  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  She has very social barriers to care including housing insecurity and financial insecurity. Patient states that she had ER visits at least 4 times in July 2024 in [place] due to complaints of severe weakness, sickness with nausea, vomiting, decreased appetite, she reports that she felt better after receiving steroid in the ER.  She mentioned that she requested to treat for adrenal insufficiency in the ER visit, she mentions she had her cortisol checked and was low no records available to review. Patient mentioned that she has mast cell activation syndrome, in the past she was following with immunology, she had required TPN, tube feeding via G-tube, for various reasons, no detail records available, she mentions she had also required long-term IV glucocorticoid for inflammation and also for adrenal insufficiency.  However she has not been on any form of glucocorticoid regularly from summer 2023.  She states she was doing okay without glucocorticoid replacement at that time.  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  She has been following with orthopedic and had multiple cortisone injection/triamcinolone, at least 4 times, with records reviewed from her patient portal on the phone injection was in November 25, 2022 and December 27, 2022.  Patient reports there is a plan for right knee surgery and working with her primary care provider for medical clearance.  Patient mentioned that she  had been on glucocorticoid replacement prednisone, dexamethasone, Solu-Medrol, Solu-Cortef at various times in the past. On  December 30, 2018 4 in the morning 8:44 AM patient had cortisol of 0.8 and ACTH undetectable -1.5 however she had cortisone injection to her right knee triamcinolone on August 12.  Patient is concerned about these levels, explained that level of cortisol of 0.8 and undetectable ACTH is expected and normal in this context of prior steroid injection.  -In September 2024: At initial endocrinology visit, patient likely has adrenal insufficiency secondary to long-term use of exogenous glucocorticoid.  Hydrocortisone was adjusted to 20 mg in the morning and 10 mg in the late afternoon.  Patient was asked to hold off on fludrocortisone.   # Thyroid nodule: Patient reports history of thyroid nodules about 10 years ago when she was evaluated in [place].  She states she has radioactive iodine uptake and scan and told to have multiple thyroid nodules including partially cystic and solid.  She does not recall having biopsy however she never had follow-up ultrasound after that.  Patient had normal TSH of 1.550 in August 2024.  She is not on thyroid medication. -Ultrasound thyroid in February 12, 2023 normal sonographic appearance of thyroid gland with no thyroid nodules.  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  She has 3 babies, one of the deliveries was emergency cesarean section.  Patient mentioned that she did not have menstrual cycle for sometime however recently having irregular menstrual cycle.  She was also referred to OB/GYN for the evaluation and management.  Interval history: Patient reports she was hospitalized recently at the local hospital due to seizure.  She was told that she had all the normal electrolytes.  After adjusting dose of hydrocortisone she said her energy level has somewhat increased, no more sweating and no more jitteriness.  However she still does not feel good overall.  She has not been taking fludrocortisone.  She takes hydrocortisone  increased dose usually double the dose in case of illness or not feeling good.  Denies nausea, vomiting or abdominal pain.  Denies lightheadedness.  Blood pressure normal in the clinic today.  REVIEW OF SYSTEMS:  As per history of present illness.   PAST MEDICAL HISTORY: Past Medical History:  Diagnosis Date   ADD (attention deficit disorder)    Adrenal cortical hypofunction (HCC)    allergic rhinnitis    Arthritis    Asthma    Immune deficiency disorder (HCC)    Leukocytosis    Lyme disease    MCAD (medium-chain acyl-CoA dehydrogenase deficiency) (HCC)    solar urticaria    Thyroid disease     PAST SURGICAL HISTORY: Past Surgical History:  Procedure Laterality Date   ABDOMINAL SURGERY     ballon sinus plasty     IR FLUORO GUIDE CV LINE RIGHT  06/08/2021   IR FLUORO GUIDE CV LINE RIGHT  07/21/2021   IR REMOVAL TUN CV CATH W/O FL  06/09/2022   IR US GUIDE VASC ACCESS RIGHT  07/21/2021    ALLERGIES: Allergies  Allergen Reactions   Cinnamon Anaphylaxis   Barley Grass Hives   Beta Adrenergic Blockers Other (See Comments)    Patient states it is contraindicated for her.    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" - can take it but has to take zyrtec and zantac prior  Lactose Intolerance (Gi)    Mustard Hives and Itching   Risperdal [Risperidone]     Adverse reactions   Tomato Hives    FAMILY HISTORY:  Family History  Problem Relation Age of Onset   Renal cancer Mother    Osteoporosis Mother    Lumbar disc disease Mother    Skin cancer Father    Drug abuse Sister    Hepatitis C Brother    Heart disease Brother    Asthma Brother     SOCIAL HISTORY: Social History   Socioeconomic History   Marital status: Divorced    Spouse name: Not on file   Number of children: Not on file   Years of education: Not on file   Highest education level: Not on file  Occupational  History   Not on file  Tobacco Use   Smoking status: Never   Smokeless tobacco: Never  Vaping Use   Vaping status: Some Days   Substances: Nicotine  Substance and Sexual Activity   Alcohol use: Never   Drug use: Yes    Types: Marijuana   Sexual activity: Yes  Other Topics Concern   Not on file  Social History Narrative   Not on file   Social Determinants of Health   Financial Resource Strain: Not on file  Food Insecurity: Not on file  Transportation Needs: Not on file  Physical Activity: Not on file  Stress: Not on file  Social Connections: Not on file    MEDICATIONS:  Current Outpatient Medications  Medication Sig Dispense Refill   albuterol (VENTOLIN HFA) 108 (90 Base) MCG/ACT inhaler Inhale 1-2 puffs into the lungs every 6 (six) hours as needed for wheezing or shortness of breath.     cetirizine (ZYRTEC) 10 MG tablet Take 10 mg by mouth in the morning and at bedtime.     cromolyn (GASTROCROM) 100 MG/5ML solution Take 200 mg by mouth 4 (four) times daily.     EPINEPHrine (ADRENALIN) 1 MG/ML SOLN Inject 0.3 mLs into the muscle as needed for anaphylaxis.     hydrocortisone (CORTEF) 10 MG tablet Take 2 tab in the morning and 1 tab in the afternoon 2-4 PM and increase dose as needed as instructed. 350 tablet 3   SYMBICORT 160-4.5 MCG/ACT inhaler Inhale 2 puffs into the lungs in the morning and at bedtime.     tezepelumab-ekko (TEZSPIRE) 210 MG/1. syringe Inject 210 mg into the skin every 30 (thirty) days.     Vitamin D, Ergocalciferol, (DRISDOL) 1.25 MG (50000 UNIT) CAPS capsule Take 50,000 Units by mouth 2 (two) times a week. No set days     XOLAIR 75 MG/0.5ML prefilled syringe Inject 375 mg into the skin every 14 (fourteen) days.     CATHFLO ACTIVASE 2 MG injection 2 mg by Intracatheter route daily as needed for open catheter.     clemastine (TAVIST) 2.68 MG TABS tablet Take 2.68 mg by mouth daily as needed (allergies).     clonazePAM (KLONOPIN) 1 MG tablet Take 1 mg by  mouth 4 (four) times daily. (Patient not taking: Reported on 02/26/2022)     cloNIDine (CATAPRES) 0.1 MG tablet Take 0.1 mg by mouth daily as needed (high blood pressure). (Patient not taking: Reported on 02/26/2022)     famotidine (PEPCID) 40 MG tablet Take 40 mg by mouth daily as needed (reflux/to prevent anaphylaxis).     fludrocortisone (FLORINEF) 0.1 MG tablet Take 0.1 mg by mouth daily. (Patient not taking: Reported on 02/26/2022)  gabapentin (NEURONTIN) 600 MG tablet Take 600 mg by mouth 3 (three) times daily. (Patient not taking: Reported on 02/26/2022)     hydrOXYzine (VISTARIL) 25 MG capsule Take 100 mg by mouth in the morning and at bedtime.     metoCLOPramide (REGLAN) 10 MG tablet Take 1 tablet (10 mg total) by mouth every 6 (six) hours. (Patient not taking: Reported on 02/26/2022) 30 tablet 0   ondansetron (ZOFRAN-ODT) 8 MG disintegrating tablet Take 8 mg by mouth every 8 (eight) hours as needed for nausea or vomiting.     Probiotic Product (PROBIOTIC PO) Take 1 tablet by mouth daily.     scopolamine (TRANSDERM-SCOP) 1 MG/3DAYS Place 1 patch (1.5 mg total) onto the skin every 3 (three) days. (Patient not taking: Reported on 02/26/2022) 10 patch 0   spironolactone (ALDACTONE) 25 MG tablet Take 25 mg by mouth daily. (Patient not taking: Reported on 02/26/2022)     tiZANidine (ZANAFLEX) 4 MG tablet Take 4 mg by mouth in the morning and at bedtime. (Patient not taking: Reported on 02/26/2022)     No current facility-administered medications for this visit.    PHYSICAL EXAM: Vitals:   04/13/23 1343  BP: 116/60  Pulse: 93  Resp: 20  SpO2: 99%  Weight: 180 lb 6.4 oz (81.8 kg)  Height: 5\' 7"  (1.702 m)    Body mass index is 28.25 kg/m.   General: Well developed, well nourished female in no apparent distress. Appropriate for age.  HEENT: AT/[place], no external lesions. Hearing intact to the spoken word Eyes: Conjunctiva clear and no icterus. Neck: Trachea midline, neck supple  without appreciable thyromegaly or lymphadenopathy and no palpable thyroid nodules Lungs: Clear to auscultation, no wheeze. Respirations not labored Heart: S1S2, Regular in rate and rhythm. Abdomen: Soft, non tender Neurologic: Alert, oriented, normal speech, deep tendon biceps reflexes normal,  no gross focal neurological deficit Extremities: no pedal pitting edema, no tremors of outstretched hands. Right knee with bandage.  Skin: Warm, color good.   PERTINENT HISTORIC LABORATORY AND IMAGING STUDIES:  All pertinent laboratory results were reviewed. Please see HPI also for further details.   ASSESSMENT / PLAN  1. Adrenal insufficiency (HCC)     -Patient likely has adrenal insufficiency secondary to long-term use of exogenous glucocorticoid.  She had been on multiple injection of triamcinolone for knee and shoulder including most recent injections were in July and in August 2024.  She had been on various systemic steroid including prednisone, dexamethasone, Solu-Cortef in the past at various times. 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  -Patient is planning for right knee surgery in the near future. Patient also mentioned that she might need more steroid injection into her knee and shoulder in coming days.  In this context. I would not plan for ACTH stimulation test for the definitive test for adrenal insufficiency.  It is very likely that she has adrenal insufficiency secondary to exogenous use of glucocorticoid.  Plan: -Continue hydrocortisone 20 mg in the morning and 10 mg in the late afternoon 2 to 4 PM.  Discussed about illness rule of taking extra dose hydrocortisone to take 2 times the usual dose in case of minor illness and up to 3 times the usual dose in case of major  illness.  Discussed that if she cannot tolerate oral medications need to go to ER for IV steroid. -Advised to get medical alert for adrenal  insufficiency. -Do not restart fludrocortisone, probably  she does not require it. Advised to monitor blood pressure at home, if her blood pressure became low can restart fludrocortisone as well. -In case of surgery she will need stress dose of IV steroid in perioperative period. -Check BMP today.  Diagnoses and all orders for this visit:  Adrenal insufficiency (HCC) -     Basic Metabolic Panel (BMET)     DISPOSITION Follow up in clinic in 3 months suggested.  All questions answered and patient verbalized understanding of the plan.   [place] Melford Tullier, MD [facility] Endocrinology [facility] [address] [place], [address] [place], [place] [address] Phone # [phone]  At least part of this note was generated using voice recognition software. Inadvertent word errors may have occurred, which were not recognized during the proofreading process.

## 2023-04-14 LAB — BASIC METABOLIC PANEL
BUN: 11 mg/dL (ref 7–25)
CO2: 27 mmol/L (ref 20–32)
Calcium: 9.6 mg/dL (ref 8.6–10.2)
Chloride: 102 mmol/L (ref 98–110)
Creat: 0.87 mg/dL (ref 0.50–0.97)
Glucose, Bld: 94 mg/dL (ref 65–99)
Potassium: 4.4 mmol/L (ref 3.5–5.3)
Sodium: 138 mmol/L (ref 135–146)

## 2023-05-02 ENCOUNTER — Telehealth: Payer: Self-pay | Admitting: Endocrinology

## 2023-05-02 NOTE — Telephone Encounter (Signed)
Patient advised that she is need approval from Dr. Erroll Luna to receive a steriod injection in her shoulder, she is asking to have a verbal or fax confirmation to her PCP- Dr. Michel Santee [phone] [phone] At [facility] family medicine, patient asking to be notified when done.

## 2023-05-02 NOTE — Telephone Encounter (Signed)
It is okay to get a steroid injection if needed.  She can still continue her usual dose of hydrocortisone for adrenal insufficiency.  Melanie Michole Lecuyer, MD [facility] Endocrinology [facility] [address] [place], [address] [place], [place] [address] Phone # [phone]

## 2023-07-14 ENCOUNTER — Ambulatory Visit: Payer: Medicare HMO | Admitting: Endocrinology

## 2023-07-18 ENCOUNTER — Encounter: Payer: Self-pay | Admitting: Endocrinology

## 2023-07-18 ENCOUNTER — Ambulatory Visit: Payer: Medicare HMO | Admitting: Endocrinology

## 2023-07-18 VITALS — BP 108/64 | HR 76 | Ht 67.0 in | Wt 178.0 lb

## 2023-07-18 DIAGNOSIS — E2740 Unspecified adrenocortical insufficiency: Secondary | ICD-10-CM

## 2023-07-18 NOTE — Progress Notes (Signed)
 Outpatient Endocrinology Note [place] Orvell Careaga, MD  07/18/23  Patient's Name: Melanie Graves    DOB: 1985-12-12    MRN: [id number]  REASON OF VISIT: Follow up of adrenal insufficiency.  REFERRING PROVIDER: Joaquin Courts, DO  PCP:  Pcp, No  HISTORY OF PRESENT ILLNESS:   Oliver Neuwirth is a 38 y.o. old female with past medical history listed below, is here for follow up adrenal insufficiency.  Pertinent history: Patient has complicated and complex medical history.  Patient was referred to endocrinology for evaluation of abnormal cortisol level and concern of adrenal insufficiency, was initially seen in September 2024.  -Per initial consult in September 2024 in endocrinology clinic : patient was evaluated in December 30, 2022 by primary care provider and later started, and patient has been taking hydrocortisone 10 mg 3 times a day and fludrocortisone 0.1 mg 2 times a day.  Referral medical record from Dr. 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  Recurrent major depression, anxiety disorder, PTSD, mast cell disorder, mast cell activation syndrome, chronic pain syndrome etc. She has history of substance use/marijuana use. She was evaluated for leg/right knee surgery for preoperative medical evaluation. With a primary care review mentioned that she was medically cleared for surgery pending lab, mentioned about adrenal insufficiency and monitoring blood pressure and possible reaction due to low cortisol level. Patient has antinuclear antibody detected, has been taking Plaquenil and primary care as referred to rheumatology. On August 15 patient had checked ACTH which was undetectable <1.5 and cortisol 0.8 in the morning 8:44 AM. Labs on August 15 TSH 1.550 normal.  Iron studies normal.  Magnesium normal.  CMP with normal serum sodium 138, glucose 84, potassium 3.9, normal serum calcium 9.4,  normal liver enzymes, creatinine 1.52, EGFR 122. With review of chart patient had multiple ER visits with various complaints including myalgia, behavioral disturbance, anxiety.  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  She has very social barriers to care including housing insecurity and financial insecurity. Patient states that she had ER visits at least 4 times in July 2024 in [place] due to complaints of severe weakness, sickness with nausea, vomiting, decreased appetite, she reports that she felt better after receiving steroid in the ER.  She mentioned that she requested to treat for adrenal insufficiency in the ER visit, she mentions she had her cortisol checked and was low no records available to review. Patient mentioned that she has mast cell activation syndrome, in the past she was following with immunology, she had required TPN, tube feeding via G-tube, for various reasons, no detail records available, she mentions she had also required long-term IV glucocorticoid for inflammation and also for adrenal insufficiency.  However she has not been on any form of glucocorticoid regularly from summer 2023.  She states she was doing okay without glucocorticoid replacement at that time.  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  She has been following with orthopedic and had multiple cortisone injection/triamcinolone, at least 4 times, with records reviewed from her patient portal on the phone injection was in November 25, 2022 and December 27, 2022.  Patient reports there is a plan for right knee surgery and working with her primary care provider for medical clearance.  Patient mentioned that she  had been on glucocorticoid replacement prednisone, dexamethasone, Solu-Medrol, Solu-Cortef at various times in the past. On  December 30, 2018 4 in the morning 8:44 AM patient had cortisol of 0.8 and ACTH undetectable -1.5 however she had cortisone injection to her right knee triamcinolone on August 12.  Patient is concerned about these levels, explained that level of cortisol of 0.8 and undetectable ACTH is expected and normal in this context of prior steroid injection.  -In September 2024: At initial endocrinology visit, patient likely has adrenal insufficiency secondary to long-term use of exogenous glucocorticoid.  Hydrocortisone was adjusted to 20 mg in the morning and 10 mg in the late afternoon.  Patient was asked to hold off on fludrocortisone.   # Thyroid nodule: Patient reports history of thyroid nodules about 10 years ago when she was evaluated in [place].  She states she has radioactive iodine uptake and scan and told to have multiple thyroid nodules including partially cystic and solid.  She does not recall having biopsy however she never had follow-up ultrasound after that.  Patient had normal TSH of 1.550 in August 2024.  She is not on thyroid medication. -Ultrasound thyroid in February 12, 2023 normal sonographic appearance of thyroid gland with no thyroid nodules.  No ultrasound thyroid monitoring is required unless clinically indicated.  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  She has 3 babies, one of the deliveries was emergency cesarean section.  Patient mentioned that she did not have menstrual cycle for sometime however recently having irregular menstrual cycle.  She was also referred to OB/GYN for the evaluation and management.  Interval history: Patient has been taking hydrocortisone 20 mg in the morning and 10 mg in the afternoon.  For last 2 weeks she has felt fatigue and respiratory difficulties, she reports she was evaluated locally and may be considering for antibiotic or a steroid treatment.  Discussed that if she become ill or not feeling good she can  take the hydrocortisone and double the dose for 3 to 5 days.  However if she gets steroid in the form of prednisone or methylprednisolone for other reasons she does not increase the dose of hydrocortisone.  Prior to 2 weeks she was feeling fairly okay.  She had taken high-dose of prednisone for about 6 days in December.  No nausea or vomiting.  No abdominal pain.  Occasional lightheadedness.  Blood pressure is normal in the clinic today.  She has not been taking fludrocortisone.   She has a plan for right knee surgery, following with orthopedic and reports that she has visit with anesthesia in the near future.  REVIEW OF SYSTEMS:  As per history of present illness.   PAST MEDICAL HISTORY: Past Medical History:  Diagnosis Date   ADD (attention deficit disorder)    Adrenal cortical hypofunction (HCC)    allergic rhinnitis    Arthritis    Asthma    Immune deficiency disorder (HCC)    Leukocytosis    Lyme disease    MCAD (medium-chain acyl-CoA dehydrogenase deficiency) (HCC)    solar urticaria    Thyroid disease     PAST SURGICAL HISTORY: Past Surgical History:  Procedure Laterality Date   ABDOMINAL SURGERY     ballon sinus plasty     IR FLUORO GUIDE CV LINE RIGHT  06/08/2021   IR FLUORO GUIDE CV LINE RIGHT  07/21/2021   IR REMOVAL TUN CV CATH W/O FL  06/09/2022   IR US  GUIDE VASC ACCESS RIGHT  07/21/2021    ALLERGIES: Allergies  Allergen Reactions   Cinnamon Anaphylaxis   Fluoxetine     Other Reaction(s): other  fluoxetine   Barley Grass Hives   Beta Adrenergic Blockers Other (See Comments)    Patient states it is contraindicated for her.    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" - can take it but has to take zyrtec and zantac prior    Lactose Intolerance (Gi)    Menthol     Other Reaction(s): Unknown   Mustard Hives and Itching   Risperdal [Risperidone]     Adverse  reactions   Tomato Hives    FAMILY HISTORY:  Family History  Problem Relation Age of Onset   Renal cancer Mother    Osteoporosis Mother    Lumbar disc disease Mother    Skin cancer Father    Drug abuse Sister    Hepatitis C Brother    Heart disease Brother    Asthma Brother     SOCIAL HISTORY: Social History   Socioeconomic History   Marital status: Divorced    Spouse name: Not on file   Number of children: Not on file   Years of education: Not on file   Highest education level: Not on file  Occupational History   Not on file  Tobacco Use   Smoking status: Never   Smokeless tobacco: Never  Vaping Use   Vaping status: Some Days   Substances: Nicotine  Substance and Sexual Activity   Alcohol use: Never   Drug use: Yes    Types: Marijuana   Sexual activity: Yes  Other Topics Concern   Not on file  Social History Narrative   Not on file   Social Drivers of Health   Financial Resource Strain: Not on file  Food Insecurity: Not on file  Transportation Needs: Not on file  Physical Activity: Not on file  Stress: Not on file  Social Connections: Not on file    MEDICATIONS:  Current Outpatient Medications  Medication Sig Dispense Refill   albuterol (VENTOLIN HFA) 108 (90 Base) MCG/ACT inhaler Inhale 1-2 puffs into the lungs every 6 (six) hours as needed for wheezing or shortness of breath.     CATHFLO ACTIVASE 2 MG injection 2 mg by Intracatheter route daily as needed for open catheter.     cefUROXime (CEFTIN) 250 MG tablet TAKE 1 TABLET BY MOUTH TWICE DAILY FOR 14 DAYS     cetirizine (ZYRTEC) 10 MG tablet Take 10 mg by mouth in the morning and at bedtime.     clemastine (TAVIST) 2.68 MG TABS tablet Take 2.68 mg by mouth daily as needed (allergies).     clonazePAM (KLONOPIN) 1 MG tablet Take 1 mg by mouth 4 (four) times daily.     cloNIDine (CATAPRES) 0.1 MG tablet Take 0.1 mg by mouth daily as needed (high blood pressure).     dexamethasone (DECADRON) 1 MG tablet  Take by mouth.     DULoxetine (CYMBALTA) 60 MG capsule Take 120 mg by mouth daily.     EPINEPHrine (ADRENALIN) 1 MG/ML SOLN Inject 0.3 mLs into the muscle as needed for anaphylaxis.     famotidine (PEPCID) 40 MG tablet Take 40 mg by mouth 2 (two) times daily.     ferrous sulfate 325 (65 FE) MG tablet      fludrocortisone (FLORINEF) 0.1  MG tablet Take 0.1 mg by mouth daily.     gabapentin (NEURONTIN) 600 MG tablet Take 600 mg by mouth 3 (three) times daily.     HYDROcodone-acetaminophen (NORCO) 10-325 MG tablet TAKE 1 TABLET BY MOUTH THREE TIMES DAILY AS NEEDED FOR CHRONIC PAIN. DO NOT EXCEED 3 TABLETS DAILY     hydrocortisone (CORTEF) 10 MG tablet Take 2 tab in the morning and 1 tab in the afternoon 2-4 PM and increase dose as needed as instructed. 350 tablet 3   hydrOXYzine (ATARAX) 25 MG tablet TAKE 1-4 TABLETS BY MOUTH EVERY 6 HOURS AS NEEDED TO CONTROL ITCHING. DO NOT TAKE MORE THAN 8 TABLETS A DAY. TITRATE SLOWLY TO AVOID SEDATION     ketotifen (ZADITOR) 0.035 % ophthalmic solution Apply to eye.     lamoTRIgine (LAMICTAL) 25 MG tablet TAKE 1 TABLET BY MOUTH TWICE DAILY FOR MOOD     levETIRAcetam (KEPPRA) 750 MG tablet Take 750 mg by mouth 2 (two) times daily.     loperamide (IMODIUM) 2 MG capsule SMARTSIG:1 Capsule(s) By Mouth PRN     methocarbamol (ROBAXIN) 750 MG tablet Take 750 mg by mouth 3 (three) times daily as needed.     metoCLOPramide (REGLAN) 10 MG tablet Take 1 tablet (10 mg total) by mouth every 6 (six) hours. 30 tablet 0   mometasone (NASONEX) 50 MCG/ACT nasal spray SHAKE LIQUID AND USE 2 SPRAYS IN EACH NOSTRIL EVERY DAY     naloxone (NARCAN) nasal spray 4 mg/0.1 mL SMARTSIG:Both Nares     OLANZapine (ZYPREXA) 5 MG tablet Take 5 mg by mouth at bedtime.     ondansetron (ZOFRAN-ODT) 8 MG disintegrating tablet Take 8 mg by mouth every 8 (eight) hours as needed for nausea or vomiting.     Probiotic Product (PROBIOTIC PO) Take 1 tablet by mouth daily.     scopolamine  (TRANSDERM-SCOP) 1 MG/3DAYS Place 1 patch (1.5 mg total) onto the skin every 3 (three) days. 10 patch 0   spironolactone (ALDACTONE) 25 MG tablet Take 25 mg by mouth daily.     SYMBICORT 160-4.5 MCG/ACT inhaler Inhale 2 puffs into the lungs in the morning and at bedtime.     tezepelumab-ekko (TEZSPIRE) 210 MG/1. syringe Inject 210 mg into the skin every 30 (thirty) days.     tiZANidine (ZANAFLEX) 4 MG tablet Take 4 mg by mouth in the morning and at bedtime.     Vitamin D, Ergocalciferol, (DRISDOL) 1.25 MG (50000 UNIT) CAPS capsule Take 50,000 Units by mouth 2 (two) times a week. No set days     XOLAIR 75 MG/0.5ML prefilled syringe Inject 375 mg into the skin every 14 (fourteen) days.     cromolyn (GASTROCROM) 100 MG/5ML solution Take 200 mg by mouth 4 (four) times daily. (Patient not taking: Reported on 07/18/2023)     No current facility-administered medications for this visit.    PHYSICAL EXAM: Vitals:   07/18/23 1447  BP: 108/64  Pulse: 76  SpO2: 98%  Weight: 178 lb (80.7 kg)  Height: 5\' 7"  (1.702 m)    Body mass index is 27.88 kg/m.   General: Well developed, well nourished female in no apparent distress. Appropriate for age.  HEENT: AT/[place], no external lesions. Hearing intact to the spoken word Eyes: Conjunctiva clear and no icterus. Neck: Trachea midline, neck supple  Lungs: Clear to auscultation, no wheeze. Respirations not labored Heart: S1S2, Regular in rate and rhythm. Abdomen: Soft, non tender Neurologic: Alert, oriented, normal speech Extremities: no pedal  pitting edema, no tremors of outstretched hands.   Skin: Warm, color good.   PERTINENT HISTORIC LABORATORY AND IMAGING STUDIES:  All pertinent laboratory results were reviewed. Please see HPI also for further details.   ASSESSMENT / PLAN  1. Adrenal insufficiency (HCC)    -Patient likely has adrenal insufficiency secondary to long-term use of exogenous glucocorticoid.  She had been on multiple injection of  triamcinolone for knee and shoulder including most recent injections were in July and in August 2024.  She had been on various systemic steroid including prednisone, dexamethasone, Solu-Cortef in the past at various times. 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  -Patient is planning for right knee surgery in the near future.  Patient had high dose of prednisone in December.  Did not plan for ACTH stimulation test for the definitive test for adrenal insufficiency, in the context of frequent systemic steroid use, anticipated use of steroid for the knee problem and planning for knee surgery.  It is very likely that she has adrenal insufficiency secondary to exogenous use of glucocorticoid.  Plan: -Continue hydrocortisone 20 mg in the morning and 10 mg in the late afternoon 2 to 4 PM.  Discussed about illness rule of taking extra dose hydrocortisone to take 2 times the usual dose in case of minor illness and up to 3 times the usual dose in case of major  illness, for 3 to 5 days.  Discussed that if she cannot tolerate oral medications need to go to ER for IV steroid.  Discussed in detail.  Also discussed that do not increase the dose of hydrocortisone unless it is necessary.  Higher dose of hydrocortisone can have side effects in terms of decreasing immunity, interfere with wound healing and weight gain. -Advised to get medical alert for adrenal insufficiency. -Do not restart fludrocortisone, probably she does not require it. Advised to monitor blood pressure at home, if her blood pressure became low can restart fludrocortisone as well. -In case of surgery she will need stress dose of IV steroid in perioperative period. -Consider ACTH stimulation test after she completed her recovery from anticipated knee surgery. -Check BMP today.  Aaliyan was seen today for follow-up.  Diagnoses and all orders for this visit:  Adrenal insufficiency  (HCC) -     BASIC METABOLIC PANEL WITH GFR    DISPOSITION Follow up in clinic in 3 months suggested.  All questions answered and patient verbalized understanding of the plan.   [place]ra[place] Marigrace Mccole, MD [facility] Endocrinology [facility] [address] [place], [address] [place], [place] [address] Phone # [phone]  At least part of this note was generated using voice recognition software. Inadvertent word errors may have occurred, which were not recognized during the proofreading process.

## 2023-07-19 ENCOUNTER — Encounter: Payer: Self-pay | Admitting: Endocrinology

## 2023-07-19 LAB — BASIC METABOLIC PANEL WITH GFR
BUN: 9 mg/dL (ref 7–25)
CO2: 29 mmol/L (ref 20–32)
Calcium: 8.8 mg/dL (ref 8.6–10.2)
Chloride: 100 mmol/L (ref 98–110)
Creat: 0.72 mg/dL (ref 0.50–0.97)
Glucose, Bld: 97 mg/dL (ref 65–99)
Potassium: 3.8 mmol/L (ref 3.5–5.3)
Sodium: 140 mmol/L (ref 135–146)
eGFR: 110 mL/min/{1.73_m2} (ref >=60)

## 2023-08-19 ENCOUNTER — Telehealth: Payer: Self-pay

## 2023-08-19 NOTE — Telephone Encounter (Signed)
 Patient called for advisement from MD

## 2023-08-22 NOTE — Telephone Encounter (Signed)
 She should take the increased dose of hydrocortisone, which would be 2-3 times of her current usual dose starting prior to plan for abortion for 3 to 5 days.

## 2023-09-21 ENCOUNTER — Telehealth: Payer: Self-pay

## 2023-09-21 DIAGNOSIS — I951 Orthostatic hypotension: Secondary | ICD-10-CM

## 2023-09-21 MED ORDER — MIDODRINE HCL 5 MG PO TABS: 5.0000 mg | ORAL_TABLET | Freq: Three times a day (TID) | ORAL | 3 refills | Status: AC

## 2023-09-21 NOTE — Telephone Encounter (Signed)
 Patient called on Monday stating that she has been having some low blood pressures when she goes from sitting to standing. She has been taking the Midodrine (old rx from 1 year ago) it has been helping keep her BP UP in the 110-114 systolic.   I did advise to her that you advised to reach out to the prescribing doctor if she is going to be taking it as she needs it and to double up on her Hydrocortisone .   She wants to know can you take over prescribing the Midodrine because that provider treated her when she did not have any insurance.

## 2023-09-21 NOTE — Telephone Encounter (Signed)
 Sent prescription for midodrine.  She can have a trial of taking midodrine to check if it helps with her orthostatic hypotension /low blood pressure.  If she continue to have low blood pressure advised to discuss and have follow-up with primary care provider as well.

## 2023-10-18 ENCOUNTER — Encounter: Payer: Self-pay | Admitting: Endocrinology

## 2023-10-18 ENCOUNTER — Ambulatory Visit: Admitting: Endocrinology

## 2023-10-18 VITALS — BP 122/68 | HR 95 | Resp 20 | Ht 67.0 in | Wt 180.8 lb

## 2023-10-18 DIAGNOSIS — E041 Nontoxic single thyroid nodule: Secondary | ICD-10-CM

## 2023-10-18 DIAGNOSIS — R5382 Chronic fatigue, unspecified: Secondary | ICD-10-CM

## 2023-10-18 DIAGNOSIS — I951 Orthostatic hypotension: Secondary | ICD-10-CM

## 2023-10-18 DIAGNOSIS — E2740 Unspecified adrenocortical insufficiency: Secondary | ICD-10-CM

## 2023-10-18 NOTE — Progress Notes (Unsigned)
 Outpatient Endocrinology Note [place] Johnay Mano, MD  10/19/23  Patient's Name: Melanie Graves    DOB: 1985-11-25    MRN: [id number]  REASON OF VISIT: Follow up of adrenal insufficiency.  REFERRING PROVIDER: Favero, John Patrick, DO  PCP:  Pcp, No  HISTORY OF PRESENT ILLNESS:   Melanie Graves is a 38 y.o. old female with past medical history listed below, is here for follow up adrenal insufficiency.  Pertinent history: Patient has complicated and complex medical history.  Patient was referred to endocrinology for evaluation of abnormal cortisol level and concern of adrenal insufficiency, was initially seen in September 2024.  -Per initial consult in September 2024 in endocrinology clinic : patient was evaluated in December 30, 2022 by primary care provider and later started, and patient has been taking hydrocortisone  10 mg 3 times a day and fludrocortisone  0.1 mg 2 times a day.  Referral medical record from Dr. 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  Recurrent major depression, anxiety disorder, PTSD, mast cell disorder, mast cell activation syndrome, chronic pain syndrome etc. She has history of substance use/marijuana use. She was evaluated for leg/right knee surgery for preoperative medical evaluation. With a primary care review mentioned that she was medically cleared for surgery pending lab, mentioned about adrenal insufficiency and monitoring blood pressure and possible reaction due to low cortisol level. Patient has antinuclear antibody detected, has been taking Plaquenil and primary care as referred to rheumatology. On August 15 patient had checked ACTH which was undetectable <1.5 and cortisol 0.8 in the morning 8:44 AM. Labs on August 15 TSH 1.550 normal.  Iron studies normal.  Magnesium  normal.  CMP with normal serum sodium 138, glucose 84, potassium 3.9, normal serum calcium 9.4,  normal liver enzymes, creatinine 1.52, EGFR 122. With review of chart patient had multiple ER visits with various complaints including myalgia, behavioral disturbance, anxiety.  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  She has very social barriers to care including housing insecurity and financial insecurity. Patient states that she had ER visits at least 4 times in July 2024 in [place]  due to complaints of severe weakness, sickness with nausea, vomiting, decreased appetite, she reports that she felt better after receiving steroid in the ER.  She mentioned that she requested to treat for adrenal insufficiency in the ER visit, she mentions she had her cortisol checked and was low no records available to review. Patient mentioned that she has mast cell activation syndrome, in the past she was following with immunology, she had required TPN, tube feeding via G-tube, for various reasons, no detail records available, she mentions she had also required long-term IV glucocorticoid for inflammation and also for adrenal insufficiency.  However she has not been on any form of glucocorticoid regularly from summer 2023.  She states she was doing okay without glucocorticoid replacement at that time.  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  She has been following with orthopedic and had multiple cortisone injection/triamcinolone, at least 4 times, with records reviewed from her patient portal on the phone injection was in November 25, 2022 and December 27, 2022.  Patient reports there is a plan for right knee surgery and working with her primary care provider for medical clearance.  Patient mentioned that she  had been on glucocorticoid replacement prednisone, dexamethasone, Solu-Medrol, Solu-Cortef  at various times in the past. On  December 30, 2018 4 in the morning 8:44 AM patient had cortisol of 0.8 and ACTH undetectable -1.5 however she had cortisone injection to her right knee triamcinolone on August 12.  Patient is concerned about these levels, explained that level of cortisol of 0.8 and undetectable ACTH is expected and normal in this context of prior steroid injection.  -In September 2024: At initial endocrinology visit, patient likely has adrenal insufficiency secondary to long-term use of exogenous glucocorticoid.  Hydrocortisone  was adjusted to 20 mg in the morning and 10 mg in the late afternoon.  Patient was asked to hold off on fludrocortisone .  Patient was previously taking midodrine  for orthostatic hypotension , was not taking for some time and as per patient request was restarted in May 2025.  Currently on midodrine  5 mg 3 times a day.   # Thyroid  nodule: Patient reports history of thyroid  nodules about 10 years ago when she was evaluated in [place] .  She states she has radioactive iodine uptake and scan and told to have multiple thyroid  nodules including partially cystic and solid.  She does not recall having biopsy however she never had follow-up ultrasound after that.  Patient had normal TSH of 1.550 in August 2024.  She is not on thyroid  medication. -Ultrasound thyroid  in February 12, 2023 normal sonographic appearance of thyroid  gland with no thyroid  nodules.  No ultrasound thyroid  monitoring is required unless clinically indicated.  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  She has 3 babies, one of the deliveries was emergency cesarean section.  Patient mentioned that she did not have menstrual cycle for sometime however recently having irregular menstrual cycle.  She was also referred to OB/GYN for the evaluation and management.  Interval history: Patient has been taking hydrocortisone  20 mg in the morning and 10 mg in the afternoon.  Patient has not been taking  fludrocortisone .  She restarted midodrine  5 mg 3 times a day few weeks ago.  She denies lightheadedness or dizziness.  She complains of fatigue.  No sickness, nausea or vomiting.  She states she recently got a steroid injection into her neck about 4 weeks ago.   Patient was pregnant and underwent abortion in April and took extra doses of hydrocortisone .  She has plan to see orthopedic at [facility], considering knee surgery.  REVIEW OF SYSTEMS:  As per history of present illness.   PAST MEDICAL HISTORY: Past Medical History:  Diagnosis Date   ADD (attention deficit disorder)    Adrenal cortical hypofunction (HCC)    allergic rhinnitis    Arthritis    Asthma    Immune deficiency disorder (HCC)    Leukocytosis    Lyme disease    MCAD (medium-chain acyl-CoA dehydrogenase deficiency) (HCC)    solar urticaria    Thyroid  disease     PAST SURGICAL HISTORY: Past Surgical History:  Procedure Laterality Date   ABDOMINAL SURGERY     ballon sinus plasty     IR FLUORO GUIDE CV LINE RIGHT  06/08/2021   IR FLUORO GUIDE CV LINE RIGHT  07/21/2021   IR REMOVAL TUN CV CATH W/O FL  06/09/2022   IR US  GUIDE VASC ACCESS RIGHT  07/21/2021    ALLERGIES: Allergies  Allergen Reactions   Cinnamon Anaphylaxis   Fluoxetine     Other Reaction(s): other  fluoxetine   Barley Grass  Hives   Beta Adrenergic Blockers Other (See Comments)    Patient states it is contraindicated for her.    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" - can take it but has to take zyrtec  and zantac prior    Lactose Intolerance (Gi)    Menthol     Other Reaction(s): Unknown   Mustard Hives and Itching   Risperdal [Risperidone]     Adverse reactions   Tomato Hives    FAMILY HISTORY:  Family History  Problem Relation Age of Onset   Renal cancer Mother    Osteoporosis Mother    Lumbar disc disease Mother    Skin  cancer Father    Drug abuse Sister    Hepatitis C Brother    Heart disease Brother    Asthma Brother     SOCIAL HISTORY: Social History   Socioeconomic History   Marital status: Divorced    Spouse name: Not on file   Number of children: Not on file   Years of education: Not on file   Highest education level: Not on file  Occupational History   Not on file  Tobacco Use   Smoking status: Never   Smokeless tobacco: Never  Vaping Use   Vaping status: Some Days   Substances: Nicotine  Substance and Sexual Activity   Alcohol use: Never   Drug use: Yes    Types: Marijuana   Sexual activity: Yes  Other Topics Concern   Not on file  Social History Narrative   Not on file   Social Drivers of Health   Financial Resource Strain: Not on file  Food Insecurity: Not on file  Transportation Needs: Not on file  Physical Activity: Not on file  Stress: Not on file  Social Connections: Not on file    MEDICATIONS:  Current Outpatient Medications  Medication Sig Dispense Refill   albuterol  (VENTOLIN  HFA) 108 (90 Base) MCG/ACT inhaler Inhale 1-2 puffs into the lungs every 6 (six) hours as needed for wheezing or shortness of breath.     cetirizine  (ZYRTEC ) 10 MG tablet Take 10 mg by mouth in the morning and at bedtime.     clonazePAM  (KLONOPIN ) 1 MG tablet Take 1 mg by mouth 3 (three) times daily.     cromolyn  (GASTROCROM ) 100 MG/5ML solution Take 200 mg by mouth 4 (four) times daily.     DULoxetine (CYMBALTA) 60 MG capsule Take 120 mg by mouth daily.     EPINEPHrine  (ADRENALIN ) 1 MG/ML SOLN Inject 0.3 mLs into the muscle as needed for anaphylaxis.     famotidine  (PEPCID ) 40 MG tablet Take 40 mg by mouth 2 (two) times daily.     ferrous sulfate 325 (65 FE) MG tablet      gabapentin  (NEURONTIN ) 600 MG tablet Take 600 mg by mouth 3 (three) times daily.     HYDROcodone -acetaminophen  (NORCO) 10-325 MG tablet TAKE 1 TABLET BY MOUTH THREE TIMES DAILY AS NEEDED FOR CHRONIC PAIN. DO NOT EXCEED  3 TABLETS DAILY     hydrocortisone  (CORTEF ) 10 MG tablet Take 2 tab in the morning and 1 tab in the afternoon 2-4 PM and increase dose as needed as instructed. 350 tablet 3   hydrOXYzine  (ATARAX ) 25 MG tablet TAKE 1-4 TABLETS BY MOUTH EVERY 6 HOURS AS NEEDED TO CONTROL ITCHING. DO NOT TAKE MORE THAN 8 TABLETS A DAY. TITRATE SLOWLY TO AVOID SEDATION  ketotifen  (ZADITOR ) 0.035 % ophthalmic solution Apply to eye.     lamoTRIgine (LAMICTAL) 25 MG tablet TAKE 1 TABLET BY MOUTH TWICE DAILY FOR MOOD     levETIRAcetam (KEPPRA) 750 MG tablet Take 750 mg by mouth 2 (two) times daily.     methocarbamol (ROBAXIN) 750 MG tablet Take 750 mg by mouth 3 (three) times daily as needed.     midodrine  (PROAMATINE ) 5 MG tablet Take 1 tablet (5 mg total) by mouth 3 (three) times daily with meals. 270 tablet 3   mometasone  (NASONEX ) 50 MCG/ACT nasal spray SHAKE LIQUID AND USE 2 SPRAYS IN EACH NOSTRIL EVERY DAY     naloxone  (NARCAN ) nasal spray 4 mg/0.1 mL SMARTSIG:Both Nares     OLANZapine  (ZYPREXA ) 5 MG tablet Take 5 mg by mouth at bedtime.     potassium chloride  (MICRO-K ) 10 MEQ CR capsule Take 10 mEq by mouth daily.     Probiotic Product (PROBIOTIC PO) Take 1 tablet by mouth daily.     spironolactone  (ALDACTONE ) 25 MG tablet Take 25 mg by mouth daily.     SYMBICORT 160-4.5 MCG/ACT inhaler Inhale 2 puffs into the lungs in the morning and at bedtime.     tezepelumab-ekko (TEZSPIRE) 210 MG/1. syringe Inject 210 mg into the skin every 30 (thirty) days.     Vitamin D , Ergocalciferol , (DRISDOL) 1.25 MG (50000 UNIT) CAPS capsule Take 50,000 Units by mouth 2 (two) times a week. No set days     XOLAIR  75 MG/0.5ML prefilled syringe Inject 375 mg into the skin every 14 (fourteen) days.     loperamide  (IMODIUM ) 2 MG capsule SMARTSIG:1 Capsule(s) By Mouth PRN (Patient not taking: Reported on 10/18/2023)     No current facility-administered medications for this visit.    PHYSICAL EXAM: Vitals:   10/18/23 1335  BP:  122/68  Pulse: 95  Resp: 20  SpO2: 96%  Weight: 180 lb 12.8 oz (82 kg)  Height: 5\' 7"  (1.702 m)    Body mass index is 28.32 kg/m.   General: Well developed, well nourished female in no apparent distress. Appropriate for age.  HEENT: AT/[place], no external lesions. Hearing intact to the spoken word Eyes: Conjunctiva clear and no icterus. Neck: Trachea midline, neck supple  Lungs: Clear to auscultation, no wheeze. Respirations not labored Heart: S1S2, Regular in rate and rhythm. Abdomen: Soft, non tender Neurologic: Alert, oriented, normal speech Extremities: no pedal pitting edema, no tremors of outstretched hands.   Skin: Warm, color good.   PERTINENT HISTORIC LABORATORY AND IMAGING STUDIES:  All pertinent laboratory results were reviewed. Please see HPI also for further details.   ASSESSMENT / PLAN  1. Adrenal insufficiency (HCC)   2. Thyroid  nodule   3. Orthostatic hypotension   4. Chronic fatigue     -Patient likely has adrenal insufficiency secondary to long-term use of exogenous glucocorticoid.  She had been on multiple injection of triamcinolone for knee and shoulder including injections were in July and in August 2024.  She had been on various systemic steroid including prednisone, dexamethasone, Solu-Cortef  in the past at various times. 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  -Patient is planning for right knee surgery in the near future, has plan to see orthopedic at [facility].  Patient reports she received steroid injection into her neck 4 weeks ago.   Did not plan for ACTH stimulation test for the definitive test for adrenal insufficiency, in the context of frequent  systemic steroid use, anticipated use of steroid for the knee problem and planning for knee surgery.  It is very likely that she has adrenal insufficiency secondary to exogenous use of glucocorticoid.  Plan: -Continue hydrocortisone  20 mg  in the morning and 10 mg in the late afternoon 2 to 4 PM.  Discussed about illness rule of taking extra dose hydrocortisone  to take 2 times the usual dose in case of minor illness and up to 3 times the usual dose in case of major  illness, for 3 to 5 days.  Discussed that if she cannot tolerate oral medications need to go to ER for IV steroid.  Discussed in detail.  Also discussed that do not increase the dose of hydrocortisone  unless it is necessary.  Higher dose of hydrocortisone  can have side effects in terms of decreasing immunity, interfere with wound healing and weight gain. -Advised to get medical alert for adrenal insufficiency. -Continue midodrine  5 mg 3 times a day for orthostatic hypotension. -In case of surgery she will need stress dose of IV steroid in perioperative period. -Consider ACTH stimulation test after she completed her recovery from anticipated knee surgery. -Check BMP today.  Due to complaint of persistent fatigue I would like to check thyroid  function test today.  Diagnoses and all orders for this visit:  Adrenal insufficiency (HCC) -     Basic metabolic panel with GFR  Thyroid  nodule  Orthostatic hypotension  Chronic fatigue -     T4, free -     TSH     DISPOSITION Follow up in clinic in 3 months suggested.  All questions answered and patient verbalized understanding of the plan.   [place] Elzia Hott, MD [facility] Endocr[facility] [address] [place], [address] [place], [place] [address] Phone # [phone]  At least part of this note was generated using voice recognition software. Inadvertent word errors may have occurred, which were not recognized during the proofreading process.

## 2023-10-19 ENCOUNTER — Encounter: Payer: Self-pay | Admitting: Endocrinology

## 2023-10-19 ENCOUNTER — Ambulatory Visit: Payer: Self-pay | Admitting: Endocrinology

## 2023-10-19 LAB — T4, FREE: Free T4: 1 ng/dL (ref 0.8–1.8)

## 2023-10-19 LAB — BASIC METABOLIC PANEL WITH GFR
BUN: 9 mg/dL (ref 7–25)
CO2: 28 mmol/L (ref 20–32)
Calcium: 8.9 mg/dL (ref 8.6–10.2)
Chloride: 103 mmol/L (ref 98–110)
Creat: 0.81 mg/dL (ref 0.50–0.97)
Glucose, Bld: 86 mg/dL (ref 65–99)
Potassium: 4 mmol/L (ref 3.5–5.3)
Sodium: 137 mmol/L (ref 135–146)
eGFR: 95 mL/min/{1.73_m2} (ref >=60)

## 2023-10-19 LAB — TSH: TSH: 0.92 m[IU]/L

## 2024-01-25 ENCOUNTER — Ambulatory Visit: Admitting: Endocrinology

## 2024-01-25 ENCOUNTER — Encounter: Payer: Self-pay | Admitting: Endocrinology

## 2024-01-25 VITALS — BP 120/60 | HR 127 | Ht 67.0 in | Wt 178.2 lb

## 2024-01-25 DIAGNOSIS — E2740 Unspecified adrenocortical insufficiency: Secondary | ICD-10-CM | POA: Diagnosis not present

## 2024-01-25 DIAGNOSIS — I951 Orthostatic hypotension: Secondary | ICD-10-CM

## 2024-01-25 MED ORDER — HYDROCORTISONE SOD SUC (PF) 100 MG IJ SOLR: 100.0000 mg | INTRAMUSCULAR | 99 refills | Status: DC | PRN

## 2024-01-25 MED ORDER — "SYRINGE 25G X 1"" 3 ML MISC": 99 refills | Status: AC

## 2024-01-25 MED ORDER — HYDROCORTISONE 10 MG PO TABS: ORAL_TABLET | ORAL | 3 refills | Status: AC

## 2024-01-25 NOTE — Progress Notes (Signed)
 Outpatient Endocrinology Note [place] Adlai Sinning, MD  01/25/24  Patient's Name: Melanie Graves    DOB: [birth year]    MRN: [id number]  REASON OF VISIT: Follow up of adrenal insufficiency.  REFERRING PROVIDER: Favero, John Patrick, DO  PCP:  Pcp, No  HISTORY OF PRESENT ILLNESS:   Melanie Graves is a 38 y.o. old female with past medical history listed below, is here for follow up adrenal insufficiency.  Pertinent history: Patient has complicated and complex medical history.  Patient was referred to endocrinology for evaluation of abnormal cortisol level and concern of adrenal insufficiency, was initially seen in September 2024.  -Per initial consult in September 2024 in endocrinology clinic : patient was evaluated in December 30, 2022 by primary care provider and later started, and patient has been taking hydrocortisone  10 mg 3 times a day and fludrocortisone  0.1 mg 2 times a day.  Referral medical record from Dr. 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  Recurrent major depression, anxiety disorder, PTSD, mast cell disorder, mast cell activation syndrome, chronic pain syndrome etc. She has history of substance use/marijuana use. She was evaluated for leg/right knee surgery for preoperative medical evaluation. With a primary care review mentioned that she was medically cleared for surgery pending lab, mentioned about adrenal insufficiency and monitoring blood pressure and possible reaction due to low cortisol level. Patient has antinuclear antibody detected, has been taking Plaquenil and primary care as referred to rheumatology. On August 15 patient had checked ACTH which was undetectable <1.5 and cortisol 0.8 in the morning 8:44 AM. Labs on August 15 TSH 1.550 normal.  Iron studies normal.  Magnesium  normal.  CMP with normal serum sodium 138, glucose 84, potassium 3.9, normal serum calcium  9.4,  normal liver enzymes, creatinine 1.52, EGFR 122. With review of chart patient had multiple ER visits with various complaints including myalgia, behavioral disturbance, anxiety.  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  She has very social barriers to care including housing insecurity and financial insecurity. Patient states that she had ER visits at least 4 times in July 2024 in [place]  due to complaints of severe weakness, sickness with nausea, vomiting, decreased appetite, she reports that she felt better after receiving steroid in the ER.  She mentioned that she requested to treat for adrenal insufficiency in the ER visit, she mentions she had her cortisol checked and was low no records available to review. Patient mentioned that she has mast cell activation syndrome, in the past she was following with immunology, she had required TPN, tube feeding via G-tube, for various reasons, no detail records available, she mentions she had also required long-term IV glucocorticoid for inflammation and also for adrenal insufficiency.  However she has not been on any form of glucocorticoid regularly from summer 2023.  She states she was doing okay without glucocorticoid replacement at that time.  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  She has been following with orthopedic and had multiple cortisone injection/triamcinolone, at least 4 times, with records reviewed from her patient portal on the phone injection was in November 25, 2022 and December 27, 2022.  Patient reports there is a plan for right knee surgery and working with her primary care provider for medical clearance.  Patient mentioned that she  had been on glucocorticoid replacement prednisone, dexamethasone, Solu-Medrol, Solu-Cortef  at various times in the past. On  December 30, 2018 4 in the morning 8:44 AM patient had cortisol of 0.8 and ACTH undetectable -1.5 however she had cortisone injection to her right knee triamcinolone on August 12.  Patient is concerned about these levels, explained that level of cortisol of 0.8 and undetectable ACTH is expected and normal in this context of prior steroid injection.  -In September 2024: At initial endocrinology visit, patient likely has adrenal insufficiency secondary to long-term use of exogenous glucocorticoid.  Hydrocortisone  was adjusted to 20 mg in the morning and 10 mg in the late afternoon.  Patient was asked to hold off on fludrocortisone .  Patient was previously taking midodrine  for orthostatic hypotension , was not taking for some time and as per patient request was restarted in May 2025.  Currently on midodrine  5 mg 3 times a day.   # Thyroid  nodule: Patient reports history of thyroid  nodules about 10 years ago when she was evaluated in [place] .  She states she has radioactive iodine uptake and scan and told to have multiple thyroid  nodules including partially cystic and solid.  She does not recall having biopsy however she never had follow-up ultrasound after that.  Patient had normal TSH of 1.550 in August 2024.  She is not on thyroid  medication. -Ultrasound thyroid  in February 12, 2023 normal sonographic appearance of thyroid  gland with no thyroid  nodules.  No ultrasound thyroid  monitoring is required unless clinically indicated.  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  She has 3 babies, one of the deliveries was emergency cesarean section.  Patient mentioned that she did not have menstrual cycle for sometime however recently having irregular menstrual cycle.  She was also referred to OB/GYN for the evaluation and management.  Interval history: Patient has been taking hydrocortisone  20 mg in the morning and 10 mg in the afternoon.  Patient currently on antibiotic  for MRSA skin infection on the left buttock area, status post I&D, following with wound care.  Patient reports some of mental health medications including oxycodone  were kept on hold recently and now restarted.  She had some withdrawal symptoms and stress for the body when she was not taking those medications.  She also mentioned that her hydrocortisone  was kept on hold, she is concerned about not getting hydrocortisone  for adrenal insufficiency, then later it was restarted.  Reports she had multiple blood test recently, no records available to review.  She has been taking midodrine  5 mg 3 times a day.  No nausea and vomiting.  She has plan to knee surgery in the near future and seeing ? orthopedic at [facility].  She is accompanied by significant other in the clinic today.  REVIEW OF SYSTEMS:  As per history of present illness.   PAST MEDICAL HISTORY: Past Medical History:  Diagnosis Date   ADD (attention deficit disorder)    Adrenal cortical hypofunction (HCC)    allergic rhinnitis    Arthritis    Asthma    Immune deficiency disorder (HCC)    Leukocytosis    Lyme disease    MCAD (medium-chain acyl-CoA dehydrogenase deficiency) (HCC)    solar urticaria    Thyroid  disease     PAST SURGICAL HISTORY: Past Surgical History:  Procedure Laterality Date   ABDOMINAL SURGERY     ballon sinus plasty     IR FLUORO GUIDE CV LINE RIGHT  06/08/2021  IR FLUORO GUIDE CV LINE RIGHT  07/21/2021   IR REMOVAL TUN CV CATH W/O FL  06/09/2022   IR US  GUIDE VASC ACCESS RIGHT  07/21/2021    ALLERGIES: Allergies  Allergen Reactions   Cinnamon Anaphylaxis   Fluoxetine     Other Reaction(s): other  fluoxetine   Barley Grass Hives   Beta Adrenergic Blockers Other (See Comments)    Patient states it is contraindicated for her.    Celery Oil    Chlorhexidine Hives   Fluconazole Other (See Comments)    Fixed drug eruptuions blisters Autoimmune fixed drug eruptions - can take it but has to  take zyrtec  and zantac prior    Lactose Intolerance (Gi)    Menthol     Other Reaction(s): Unknown   Mustard Hives and Itching   Risperdal [Risperidone]     Adverse reactions   Tomato Hives    FAMILY HISTORY:  Family History  Problem Relation Age of Onset   Renal cancer Mother    Osteoporosis Mother    Lumbar disc disease Mother    Skin cancer Father    Drug abuse Sister    Hepatitis C Brother    Heart disease Brother    Asthma Brother     SOCIAL HISTORY: Social History   Socioeconomic History   Marital status: Divorced    Spouse name: Not on file   Number of children: Not on file   Years of education: Not on file   Highest education level: Not on file  Occupational History   Not on file  Tobacco Use   Smoking status: Never   Smokeless tobacco: Never  Vaping Use   Vaping status: Some Days   Substances: Nicotine  Substance and Sexual Activity   Alcohol use: Never   Drug use: Yes    Types: Marijuana   Sexual activity: Yes  Other Topics Concern   Not on file  Social History Narrative   Not on file   Social Drivers of Health   Financial Resource Strain: Not on file  Food Insecurity: Not on file  Transportation Needs: Not on file  Physical Activity: Not on file  Stress: Not on file  Social Connections: Not on file    MEDICATIONS:  Current Outpatient Medications  Medication Sig Dispense Refill   albuterol  (VENTOLIN  HFA) 108 (90 Base) MCG/ACT inhaler Inhale 1-2 puffs into the lungs every 6 (six) hours as needed for wheezing or shortness of breath.     cetirizine  (ZYRTEC ) 10 MG tablet Take 10 mg by mouth in the morning and at bedtime.     clonazePAM  (KLONOPIN ) 1 MG tablet Take 1 mg by mouth 3 (three) times daily.     cromolyn  (GASTROCROM ) 100 MG/5ML solution Take 200 mg by mouth 4 (four) times daily.     DULoxetine (CYMBALTA) 60 MG capsule Take 120 mg by mouth daily.     EPINEPHrine  (ADRENALIN ) 1 MG/ML SOLN Inject 0.3 mLs into the muscle as needed for  anaphylaxis.     EPINEPHrine  0.1 MG/0.1ML SOAJ Inject 0.3 mg into the muscle.     famotidine  (PEPCID ) 40 MG tablet Take 40 mg by mouth 2 (two) times daily.     ferrous sulfate 325 (65 FE) MG tablet      gabapentin  (NEURONTIN ) 600 MG tablet Take 600 mg by mouth 3 (three) times daily. (Patient taking differently: Take 300 mg by mouth 3 (three) times daily.)     HYDROcodone -acetaminophen  (NORCO) 10-325 MG tablet TAKE 1 TABLET  BY MOUTH THREE TIMES DAILY AS NEEDED FOR CHRONIC PAIN. DO NOT EXCEED 3 TABLETS DAILY     hydrocortisone  (CORTEF ) 10 MG tablet Take 2 tab in the morning and 1 tab in the afternoon 2-4 PM and increase dose as needed as instructed. 350 tablet 3   hydrocortisone  sodium succinate  (SOLU-CORTEF ) 100 MG injection Inject 2 mLs (100 mg total) into the muscle as needed (In case of vomiting and  not able to tolerate oral and emergency situation.). 1 each prn   hydrOXYzine  (ATARAX ) 25 MG tablet TAKE 1-4 TABLETS BY MOUTH EVERY 6 HOURS AS NEEDED TO CONTROL ITCHING. DO NOT TAKE MORE THAN 8 TABLETS A DAY. TITRATE SLOWLY TO AVOID SEDATION     lamoTRIgine (LAMICTAL) 25 MG tablet TAKE 1 TABLET BY MOUTH TWICE DAILY FOR MOOD     levETIRAcetam (KEPPRA) 750 MG tablet Take 750 mg by mouth 2 (two) times daily.     loperamide  (IMODIUM ) 2 MG capsule SMARTSIG:1 Capsule(s) By Mouth PRN     methocarbamol (ROBAXIN) 750 MG tablet Take 750 mg by mouth 3 (three) times daily as needed.     midodrine  (PROAMATINE ) 5 MG tablet Take 1 tablet (5 mg total) by mouth 3 (three) times daily with meals. 270 tablet 3   mometasone  (NASONEX ) 50 MCG/ACT nasal spray SHAKE LIQUID AND USE 2 SPRAYS IN EACH NOSTRIL EVERY DAY     naloxone  (NARCAN ) nasal spray 4 mg/0.1 mL SMARTSIG:Both Nares     OLANZapine  (ZYPREXA ) 5 MG tablet Take 5 mg by mouth at bedtime.     potassium chloride  (MICRO-K ) 10 MEQ CR capsule Take 10 mEq by mouth daily.     Probiotic Product (PROBIOTIC PO) Take 1 tablet by mouth daily.     spironolactone  (ALDACTONE ) 25  MG tablet Take 25 mg by mouth daily.     SYMBICORT 160-4.5 MCG/ACT inhaler Inhale 2 puffs into the lungs in the morning and at bedtime.     Syringe/Needle, Disp, (SYRINGE 3CC/25GX1) 25G X 1 3 ML MISC Inject in the muscle as needed with solucortef 50 each prn   tezepelumab-ekko (TEZSPIRE) 210 MG/1. syringe Inject 210 mg into the skin every 30 (thirty) days.     Vitamin D , Ergocalciferol , (DRISDOL) 1.25 MG (50000 UNIT) CAPS capsule Take 50,000 Units by mouth 2 (two) times a week. No set days     XOLAIR  75 MG/0.5ML prefilled syringe Inject 375 mg into the skin every 14 (fourteen) days.     ketotifen  (ZADITOR ) 0.035 % ophthalmic solution Apply to eye. (Patient not taking: Reported on 01/25/2024)     No current facility-administered medications for this visit.    PHYSICAL EXAM: Vitals:   01/25/24 1449  BP: 120/60  Pulse: (!) 127  SpO2: 96%  Weight: 178 lb 3.2 oz (80.8 kg)  Height: 5' 7 (1.702 m)     Body mass index is 27.91 kg/m.   General: Well developed, well nourished female in no apparent distress. Appropriate for age.  HEENT: AT/[place], no external lesions. Hearing intact to the spoken word Eyes: Conjunctiva clear and no icterus. Neck: Trachea midline, neck supple  Abdomen: Soft, non tender Neurologic: Alert, oriented, normal speech Extremities: no pedal pitting edema, no tremors of outstretched hands.  Left buttock with bandage. Skin: Warm, color good.   PERTINENT HISTORIC LABORATORY AND IMAGING STUDIES:  All pertinent laboratory results were reviewed. Please see HPI also for further details.   ASSESSMENT / PLAN  1. Adrenal insufficiency (HCC)   2. Orthostatic hypotension     -  Patient likely has adrenal insufficiency secondary to long-term use of exogenous glucocorticoid.  She had been on multiple injection of triamcinolone for knee and shoulder including injections were in July and in August 2024.  She had been on various systemic steroid including prednisone,  dexamethasone, Solu-Cortef  in the past at various times. 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  -Patient is planning for right knee surgery in the near future, seeing ? orthopedic at [facility].  She is currently on antibiotic for MRSA infection in the left buttock.  Did not plan for ACTH stimulation test for the definitive test for adrenal insufficiency, in the context of frequent systemic steroid use, anticipated use of steroid for the knee problem and planning for knee surgery.  It is very likely that she has adrenal insufficiency secondary to exogenous use of glucocorticoid.  Plan: -Continue hydrocortisone  20 mg in the morning and 10 mg in the late afternoon 2 to 4 PM.  Discussed about illness rule of taking extra dose hydrocortisone  to take 2 times the usual dose in case of minor illness and up to 3 times the usual dose in case of major  illness, for 3 to 5 days.  Discussed that if she cannot tolerate oral medications need to go to ER for IV steroid.  Discussed in detail.  Also discussed that do not increase the dose of hydrocortisone  unless it is necessary.  Higher dose of hydrocortisone  can have side effects in terms of decreasing immunity, interfere with wound healing and weight gain.  -Sent prescription for hydrocortisone  IV to use in case of emergency situation or when not able to tolerate oral due to vomiting.  -Advised to get medical alert for adrenal insufficiency.  -Continue midodrine  5 mg 3 times a day for orthostatic hypotension.  -In case of surgery she will need stress dose of IV steroid in perioperative period.  -Consider ACTH stimulation test after she completed her recovery from anticipated knee surgery.  Diagnoses and all orders for this visit:  Adrenal insufficiency (HCC) -     hydrocortisone  sodium succinate  (SOLU-CORTEF ) 100 MG injection; Inject 2 mLs (100 mg total) into the muscle as needed (In case  of vomiting and  not able to tolerate oral and emergency situation.). -     Syringe/Needle, Disp, (SYRINGE 3CC/25GX1) 25G X 1 3 ML MISC; Inject in the muscle as needed with solucortef  Orthostatic hypotension    DISPOSITION Follow up in clinic in 3 months suggested.  All questions answered and patient verbalized understanding of the plan.   [place] Deavion Strider, MD [facility] Endocrinology [facility] [address] [place], [address] [place], [place] [address] Phone # [phone]  At least part of this note was generated using voice recognition software. Inadvertent word errors may have occurred, which were not recognized during the proofreading process.

## 2024-01-25 NOTE — Patient Instructions (Signed)
   Melanie Graves has adrenal insufficiency and she needs glucocorticoid and currently on hydrocortisone  as prescribed. She needs stress dose of IV hydrocortisone  in case of emergency.    [place] Jaela Yepez, MD [facility] Endocrinology [facility] [address] [place], [address] [place], [place] [address] Phone # [phone]
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
## 2024-04-30 ENCOUNTER — Telehealth: Payer: Self-pay | Admitting: Endocrinology

## 2024-04-30 NOTE — Telephone Encounter (Signed)
 For injection hydrocortisone  she only uses it for emergency situation and dose will be the same.  In regard to her daily oral hydrocortisone  dose, her current dose is hydrocortisone  20 mg in the morning and 10 mg in the late afternoon.  She can take double the dose of hydrocortisone  both in the morning and late afternoon for 3 days.  Melanie Flegal, MD [facility] Endocrinology [facility] [address] [place], [address] [place], [place] [address] Phone # [phone]
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
## 2024-05-02 ENCOUNTER — Encounter: Payer: Self-pay | Admitting: Endocrinology

## 2024-05-02 ENCOUNTER — Ambulatory Visit: Admitting: Endocrinology

## 2024-05-02 DIAGNOSIS — E2740 Unspecified adrenocortical insufficiency: Secondary | ICD-10-CM

## 2024-05-02 MED ORDER — HYDROCORTISONE SOD SUC (PF) 100 MG IJ SOLR: 100.0000 mg | INTRAMUSCULAR | 3 refills | Status: AC | PRN
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
## 2024-08-01 ENCOUNTER — Ambulatory Visit: Admitting: Endocrinology
